# Patient Record
Sex: Female | Born: 1975
Health system: Southern US, Community
[De-identification: ages and names within clinical notes are randomized; demographics above are authoritative.]

## PROBLEM LIST (undated history)

## (undated) DIAGNOSIS — K5190 Ulcerative colitis, unspecified, without complications: Secondary | ICD-10-CM

## (undated) DIAGNOSIS — D219 Benign neoplasm of connective and other soft tissue, unspecified: Secondary | ICD-10-CM

## (undated) DIAGNOSIS — F411 Generalized anxiety disorder: Secondary | ICD-10-CM

## (undated) DIAGNOSIS — F32A Depression, unspecified: Secondary | ICD-10-CM

## (undated) DIAGNOSIS — A419 Sepsis, unspecified organism: Secondary | ICD-10-CM

## (undated) DIAGNOSIS — O09529 Supervision of elderly multigravida, unspecified trimester: Secondary | ICD-10-CM

## (undated) DIAGNOSIS — Z973 Presence of spectacles and contact lenses: Secondary | ICD-10-CM

## (undated) DIAGNOSIS — Z8719 Personal history of other diseases of the digestive system: Secondary | ICD-10-CM

## (undated) DIAGNOSIS — R011 Cardiac murmur, unspecified: Secondary | ICD-10-CM

## (undated) DIAGNOSIS — D649 Anemia, unspecified: Secondary | ICD-10-CM

## (undated) HISTORY — DX: Depression, unspecified: F32.A

## (undated) HISTORY — DX: Benign neoplasm of connective and other soft tissue, unspecified: D21.9

## (undated) HISTORY — DX: Sepsis, unspecified organism: A41.9

## (undated) HISTORY — DX: Ulcerative colitis, unspecified, without complications: K51.90

## (undated) HISTORY — DX: Generalized anxiety disorder: F41.1

## (undated) HISTORY — DX: Supervision of elderly multigravida, unspecified trimester: O09.529

## (undated) HISTORY — PX: WISDOM TOOTH EXTRACTION: SHX21

## (undated) HISTORY — PX: ABDOMINAL HYSTERECTOMY: SHX81

## (undated) HISTORY — PX: UTERINE SEPTUM RESECTION: SHX5386

## (undated) HISTORY — DX: Personal history of other diseases of the digestive system: Z87.19

---

## 2000-12-24 HISTORY — PX: WISDOM TOOTH EXTRACTION: SHX21

## 2007-08-18 ENCOUNTER — Inpatient Hospital Stay (HOSPITAL_COMMUNITY): Admission: AD | Admit: 2007-08-18 | Discharge: 2007-08-20 | Payer: Self-pay | Admitting: Obstetrics and Gynecology

## 2010-11-20 ENCOUNTER — Encounter: Payer: Self-pay | Admitting: Family Medicine

## 2010-11-20 DIAGNOSIS — K5190 Ulcerative colitis, unspecified, without complications: Secondary | ICD-10-CM | POA: Insufficient documentation

## 2010-11-20 DIAGNOSIS — K529 Noninfective gastroenteritis and colitis, unspecified: Secondary | ICD-10-CM | POA: Insufficient documentation

## 2010-11-22 ENCOUNTER — Ambulatory Visit: Payer: Self-pay | Admitting: Family Medicine

## 2010-11-22 DIAGNOSIS — R209 Unspecified disturbances of skin sensation: Secondary | ICD-10-CM

## 2011-01-23 NOTE — Miscellaneous (Signed)
Summary: Past Medical History  Clinical Lists Changes  Problems: Added new problem of COLITIS, ULCERATIVE (ICD-556.9) Observations: Added new observation of NKA: T (November 27, 2010 12:03) Added new observation of REGULAREXERC: no (2010-11-27 12:03) Added new observation of DRUG USE: no (2010-11-27 12:03) Added new observation of ALCOHOL COM: yes (2010/11/27 12:03) Added new observation of SMOK STATUS: never (Nov 27, 2010 12:03) Added new observation of SOCIAL HX: Married 35 year old daughter. Never Smoked Alcohol use-yes Drug use-no Regular exercise-no  (November 27, 2010 12:03) Added new observation of MARITAL STAT: Married (Nov 27, 2010 12:03) Added new observation of FAMILY HX: Mother died of Mesothelioma 2009/09/24 (2010/11/27 12:03) Added new observation of PAST SURG HX: D & C- spontaneous abortion, 2006 (11-27-2010 12:03) Added new observation of PAST MED HX: Ulcerative colitis- Dr. Cristina Gong G2P1 (2010/11/27 12:03)      Past Medical History:    Ulcerative colitis- Dr. Trudie Reed  Past Surgical History:    D & C- spontaneous abortion, 2006    Family History: Mother died of Mesothelioma 2009/09/24  Social History: Married 35 year old daughter. Never Smoked Alcohol use-yes Drug use-no Regular exercise-no Smoking Status:  never Drug Use:  no Does Patient Exercise:  no

## 2011-01-23 NOTE — Assessment & Plan Note (Signed)
Summary: Per discussion   Vital Signs:  Patient profile:   35 year old female Weight:      122 pounds Temp:     98.3 degrees F oral Pulse rate:   66 / minute BP sitting:   90 / 60  (left arm)  Vitals Entered By: Malachi Bonds CMA (November 22, 2010 8:31 AM) CC: numbness and tingling in face   History of Present Illness: 35 yo woman here today to establish care.  GYNMarvel Plan.  PMD- none.  UC- not on meds.  follows w/ Bucchini.  rare flares.  will take prednisone as needed.  numbness- Friday had sensation of food on face but was told by husband there was nothing there.  chin then when 'totally numb' w/ exception of feeling pressure.  that night chin felt cold- 'like neuropathic'.  sxs persisted through Monday.  now almost resolved.  no recent trauma, nasal congestion, sinus pressure.  doesn't grind teeth.  was walking in the park at time of numbness- no pressure on jaw/chin.  no drooling or eye droop, facial droop, slurred speech.  no weakness/numbness of extremities.  Preventive Screening-Counseling & Management  Alcohol-Tobacco     Alcohol drinks/day: <1     Smoking Status: never  Caffeine-Diet-Exercise     Does Patient Exercise: no      Sexual History:  currently monogamous.        Drug Use:  never.    Current Medications (verified): 1)  None  Allergies (verified): No Known Drug Allergies  Family History: Mother died of Mesothelioma 2009/10/03 Breast Cancer- no Colon Cancer- no  Social History: Married- [place] daughter- Abby (2008). Never Smoked Alcohol use-yes Drug use-no Regular exercise-no Sexual History:  currently monogamous Drug Use:  never  Review of Systems      See HPI  Physical Exam  General:  Well-developed,well-nourished,in no acute distress; alert,appropriate and cooperative throughout examination Head:  Normocephalic and atraumatic without obvious abnormalities. No apparent alopecia or balding. Eyes:  No corneal or conjunctival inflammation  noted. EOMI. Perrla. Funduscopic exam benign, without hemorrhages, exudates or papilledema. Vision grossly normal. Mouth:  Oral mucosa and oropharynx without lesions or exudates.  Teeth in good repair. Neck:  No deformities, masses, or tenderness noted. Pulses:  +2 carotid Extremities:  no C/C/E Neurologic:  No cranial nerve deficits noted. Station and gait are normal. Plantar reflexes are down-going bilaterally. DTRs are symmetrical throughout. Sensory, motor and coordinative functions appear intact.   Impression & Recommendations:  Problem # 1:  COLITIS, ULCERATIVE (ICD-556.9) Assessment New follows w/ Buccini.  not on chronic meds.  steroids as needed for flares.  Problem # 2:  FACIAL PARESTHESIA (ICD-782.0) Assessment: New sxs have completely resolved.  will hold on neuro referral at this time but if sxs recur will send for immediate evaluation.  remainder of neuro exam intact.  Patient Instructions: 1)  Follow up as needed 2)  Call if this happens again and we'll do neuro 3)  Let me know if you have any questions or concerns 4)  Welcome!  We're glad to have you!   Orders Added: 1)  New Patient Level II [31438]

## 2011-04-23 ENCOUNTER — Telehealth: Payer: Self-pay | Admitting: Family Medicine

## 2011-04-23 MED ORDER — BUSPIRONE HCL 15 MG PO TABS: 15.0000 mg | ORAL_TABLET | Freq: Two times a day (BID) | ORAL | Status: AC

## 2011-04-23 NOTE — Telephone Encounter (Signed)
Pt called reporting increased anxiety due to issues at work.  Still having trouble dealing w/ mom's death.  Has never been on SSRI previously but reports she is now having panic attacks.  Would like to start meds but is considering pregnancy.  Will start Buspar (Category B).

## 2011-05-11 NOTE — Discharge Summary (Signed)
NAME:  JAEDIN, TRUMBO NO.:  [id number]   MEDICAL RECORD NO.:  [id number]          PATIENT TYPE:  INP   LOCATION:  9103                          FACILITY:  [facility]   PHYSICIAN:  Paula Compton, M.D. DATE OF BIRTH:  [birth year]   DATE OF ADMISSION:  08/18/2007  DATE OF DISCHARGE:  08/20/2007                               DISCHARGE SUMMARY   DISCHARGE DIAGNOSES:  1. Term pregnancy at 38 plus weeks, delivered.  2. Status post normal spontaneous vaginal delivery.   DISCHARGE MEDICATIONS:  1. Motrin 600 mg p.o. every 6 hours.  2. Percocet 1-2 tablets p.o. every 4 hours p.r.n.   DISCHARGE FOLLOWUP:  The patient is to followup in the office in 6 weeks  for routine postpartum exam.   HOSPITAL COURSE:  Patient is a 35 year old, G2, P0-0-1-0, who was  admitted in labor.  Her prenatal care had been relatively uncomplicated,  and she arrived contracting with cervical change noted to be 3-4 cm and  90% effaced. In the preadmission unit, labs are as follows.  Blood type  A positive, antibody negative, RPR nonreactive, rubella immune,  hepatitis B surface antigen negative, HIV negative, GC negative,  chlamydia negative, 1-hour Glucola 137, group B strep negative, first  trimester screen negative.  She had other screening test for Ashkenazi  Jewish background which was all negative.  She was a carrier for cystic  fibrosis; however, her husband was negative.  She had no other  significant issues with pregnancy.   PAST MEDICAL HISTORY:  Significant for allergies only.   PAST SURGICAL HISTORY:  She did have repair of a uterine septum  hysteroscopically.   PAST OBSTETRICAL HISTORY:  In 2006, she had a spontaneous miscarriage  with a D&C.   GYN HISTORY:  No significant past GYN history.   On admission, she was afebrile with stable vital signs.  Fetal heart  rate was reactive.  Cervix was 3-4 and 90% and a -1 station on  admission. Shortly thereafter, she was examined and  found be 5 cm and  had rupture of membranes performed. She continued to progress well with  an epidural anesthesia in place. Reached complete dilation and pushed  well with a normal spontaneous vaginal delivery of a vigorous female  infant over a second-degree laceration.  Apgars were 9/9.  Weight was 6  pounds even.  Placenta delivered spontaneously. Secondary laceration was  repaired with 2-0 Vicryl.  Cervix and rectum were intact.  Estimated  blood loss was 400 mL.   She did very well postpartum.  On postpartum day #2, she was ambulating  well.  Her discharge hemoglobin was 9.1.  She was afebrile with stable  vital signs and was doing well with feeding the baby. Therefore, she was  felt stable for discharge home was discharged to follow up in my office  in 6 weeks.      Paula Compton, M.D.  Electronically Signed     KR/MEDQ  D:  09/20/2007  T:  09/20/2007  Job:  [id number]

## 2011-05-25 ENCOUNTER — Telehealth: Payer: Self-pay | Admitting: Family Medicine

## 2011-05-25 MED ORDER — VALACYCLOVIR HCL 1 G PO TABS: 1000.0000 mg | ORAL_TABLET | Freq: Three times a day (TID) | ORAL | Status: AC

## 2011-05-25 NOTE — Telephone Encounter (Signed)
Pt called indicating she has shingles and needs Valtrex sent to the pharmacy.  Script sent.

## 2011-09-14 ENCOUNTER — Telehealth: Payer: Self-pay | Admitting: Family Medicine

## 2011-09-14 MED ORDER — CIPROFLOXACIN HCL 500 MG PO TABS: 500.0000 mg | ORAL_TABLET | Freq: Two times a day (BID) | ORAL | Status: AC

## 2011-09-14 NOTE — Telephone Encounter (Signed)
Pt reports urine dip at work shows UTI.  Will send script for cipro to pt's pharmacy.

## 2011-09-20 ENCOUNTER — Telehealth: Payer: Self-pay | Admitting: Family Medicine

## 2011-09-20 MED ORDER — AMOXICILLIN-POT CLAVULANATE 875-125 MG PO TABS: 1.0000 | ORAL_TABLET | Freq: Two times a day (BID) | ORAL | Status: AC

## 2011-09-20 NOTE — Telephone Encounter (Signed)
Pt calls to report she has an ingrown toenail that has pus accumulating- a paronychia.  Unable to get into podiatry until next week.  Will call in Augmentin.  Pt appreciative.

## 2011-10-05 LAB — CBC
Hemoglobin: 9.1 — ABNORMAL LOW
MCV: 88.7
Platelets: 173
RDW: 13.3
WBC: 8.7

## 2011-10-05 LAB — RPR: RPR Ser Ql: NONREACTIVE

## 2011-11-10 ENCOUNTER — Telehealth: Payer: Self-pay | Admitting: Family Medicine

## 2011-11-10 MED ORDER — CIPROFLOXACIN HCL 500 MG PO TABS: 500.0000 mg | ORAL_TABLET | Freq: Two times a day (BID) | ORAL | Status: AC

## 2011-11-10 MED ORDER — CIPROFLOXACIN HCL 500 MG PO TABS: 500.0000 mg | ORAL_TABLET | Freq: Two times a day (BID) | ORAL | Status: DC

## 2011-11-10 NOTE — Telephone Encounter (Signed)
Pt calls indicating she has hematuria, dysuria, frequency, urgency.  Needs abx called in.  Script sent to pharmacy.

## 2011-11-10 NOTE — Telephone Encounter (Signed)
Addended by: Midge Minium on: 11/10/2011 07:33 PM   Modules accepted: Orders

## 2011-11-10 NOTE — Telephone Encounter (Signed)
Pt's usual pharmacy was closed.  Med sent to [facility] instead.

## 2012-01-30 ENCOUNTER — Telehealth: Payer: Self-pay | Admitting: Family Medicine

## 2012-01-30 MED ORDER — AMOXICILLIN-POT CLAVULANATE 875-125 MG PO TABS: 1.0000 | ORAL_TABLET | Freq: Two times a day (BID) | ORAL | Status: AC

## 2012-01-30 NOTE — Telephone Encounter (Signed)
Pt calls w/ 10 days of sinus sxs.  Will send abx for sinus infxn.

## 2012-05-13 ENCOUNTER — Telehealth: Payer: Self-pay | Admitting: Family Medicine

## 2012-05-13 ENCOUNTER — Other Ambulatory Visit (INDEPENDENT_AMBULATORY_CARE_PROVIDER_SITE_OTHER): Payer: Self-pay

## 2012-05-13 DIAGNOSIS — R0602 Shortness of breath: Secondary | ICD-10-CM

## 2012-05-13 DIAGNOSIS — R5383 Other fatigue: Secondary | ICD-10-CM

## 2012-05-13 DIAGNOSIS — R5381 Other malaise: Secondary | ICD-10-CM

## 2012-05-13 LAB — BASIC METABOLIC PANEL
BUN: 19 mg/dL (ref 6–23)
CO2: 28 mEq/L (ref 19–32)
Chloride: 108 mEq/L (ref 96–112)
GFR: 96.06 mL/min (ref >60.00)
Glucose, Bld: 81 mg/dL (ref 70–99)
Potassium: 4.3 mEq/L (ref 3.5–5.1)
Sodium: 143 mEq/L (ref 135–145)

## 2012-05-13 LAB — CBC WITH DIFFERENTIAL/PLATELET
Basophils Relative: 0.8 % (ref 0.0–3.0)
Eosinophils Relative: 4 % (ref 0.0–5.0)
HCT: 38.9 % (ref 36.0–46.0)
MCV: 90.2 fl (ref 78.0–100.0)
Monocytes Absolute: 0.3 10*3/uL (ref 0.1–1.0)
Monocytes Relative: 6 % (ref 3.0–12.0)
Neutrophils Relative %: 58.6 % (ref 43.0–77.0)
Platelets: 259 10*3/uL (ref 150.0–400.0)
RBC: 4.31 Mil/uL (ref 3.87–5.11)
WBC: 5.6 10*3/uL (ref 4.5–10.5)

## 2012-05-13 NOTE — Telephone Encounter (Signed)
Pt called indicating she is waking multiple times a night w/ SOB.  Also having DOE.  + fatigue.  Hx of anxiety but this has been well controlled.  No recent labs.  Will get labs to r/o anemia, thyroid abnormality and due to nocturnal sxs, H pylori.  Pt expressed understanding and is in agreement w/ plan.

## 2012-05-15 ENCOUNTER — Other Ambulatory Visit: Payer: Self-pay | Admitting: Family Medicine

## 2012-05-15 ENCOUNTER — Ambulatory Visit
Admission: RE | Admit: 2012-05-15 | Discharge: 2012-05-15 | Disposition: A | Discharge status: Home / Self Care | Payer: 59 | Source: Ambulatory Visit | Attending: Family Medicine | Admitting: Family Medicine

## 2012-05-15 DIAGNOSIS — N6310 Unspecified lump in the right breast, unspecified quadrant: Secondary | ICD-10-CM

## 2012-05-21 ENCOUNTER — Other Ambulatory Visit: Payer: 59

## 2012-07-22 ENCOUNTER — Other Ambulatory Visit: Payer: Self-pay | Admitting: Family Medicine

## 2012-07-22 ENCOUNTER — Other Ambulatory Visit (INDEPENDENT_AMBULATORY_CARE_PROVIDER_SITE_OTHER): Payer: 59

## 2012-07-22 DIAGNOSIS — N912 Amenorrhea, unspecified: Secondary | ICD-10-CM

## 2012-07-22 NOTE — Addendum Note (Signed)
Addended by: Ellamae Sia on: 07/22/2012 11:48 AM   Modules accepted: Orders

## 2012-07-24 ENCOUNTER — Other Ambulatory Visit: Payer: Self-pay | Admitting: Family Medicine

## 2012-07-24 ENCOUNTER — Other Ambulatory Visit (INDEPENDENT_AMBULATORY_CARE_PROVIDER_SITE_OTHER): Payer: 59

## 2012-07-24 DIAGNOSIS — N912 Amenorrhea, unspecified: Secondary | ICD-10-CM

## 2012-07-24 LAB — HCG, QUANTITATIVE, PREGNANCY: hCG, Beta Chain, Quant, S: 6.09 m[IU]/mL

## 2012-10-17 ENCOUNTER — Other Ambulatory Visit (HOSPITAL_COMMUNITY): Payer: Self-pay | Admitting: Obstetrics and Gynecology

## 2012-10-17 DIAGNOSIS — N96 Recurrent pregnancy loss: Secondary | ICD-10-CM

## 2012-10-22 ENCOUNTER — Ambulatory Visit (HOSPITAL_COMMUNITY): Admission: RE | Admit: 2012-10-22 | Payer: 59 | Source: Ambulatory Visit

## 2012-10-22 ENCOUNTER — Ambulatory Visit (HOSPITAL_COMMUNITY): Payer: 59

## 2012-10-24 ENCOUNTER — Ambulatory Visit (HOSPITAL_COMMUNITY)
Admission: RE | Admit: 2012-10-24 | Discharge: 2012-10-24 | Disposition: A | Discharge status: Home / Self Care | Payer: 59 | Source: Ambulatory Visit | Attending: Obstetrics and Gynecology | Admitting: Obstetrics and Gynecology

## 2012-10-24 DIAGNOSIS — N979 Female infertility, unspecified: Secondary | ICD-10-CM | POA: Insufficient documentation

## 2012-10-24 DIAGNOSIS — N96 Recurrent pregnancy loss: Secondary | ICD-10-CM | POA: Insufficient documentation

## 2012-10-24 MED ORDER — IOHEXOL 300 MG/ML  SOLN
10.0000 mL | Freq: Once | INTRAMUSCULAR | Status: AC | PRN
  Administered 2012-10-24: 10 mL

## 2012-12-09 ENCOUNTER — Other Ambulatory Visit: Payer: Self-pay | Admitting: Family Medicine

## 2012-12-09 ENCOUNTER — Ambulatory Visit (INDEPENDENT_AMBULATORY_CARE_PROVIDER_SITE_OTHER)
Admission: RE | Admit: 2012-12-09 | Discharge: 2012-12-09 | Disposition: A | Discharge status: Home / Self Care | Payer: 59 | Source: Ambulatory Visit | Attending: Family Medicine | Admitting: Family Medicine

## 2012-12-09 DIAGNOSIS — M79645 Pain in left finger(s): Secondary | ICD-10-CM

## 2012-12-09 DIAGNOSIS — M79609 Pain in unspecified limb: Secondary | ICD-10-CM

## 2013-02-09 ENCOUNTER — Other Ambulatory Visit: Payer: Self-pay | Admitting: Family Medicine

## 2013-02-09 MED ORDER — HYDROCORTISONE ACE-PRAMOXINE 1-1 % RE FOAM: 1.0000 | Freq: Two times a day (BID) | RECTAL | Status: DC

## 2013-02-23 ENCOUNTER — Other Ambulatory Visit (INDEPENDENT_AMBULATORY_CARE_PROVIDER_SITE_OTHER): Payer: 59

## 2013-02-23 DIAGNOSIS — N912 Amenorrhea, unspecified: Secondary | ICD-10-CM

## 2013-02-23 DIAGNOSIS — Z3200 Encounter for pregnancy test, result unknown: Secondary | ICD-10-CM

## 2013-03-24 LAB — OB RESULTS CONSOLE GC/CHLAMYDIA
Chlamydia: NEGATIVE
Gonorrhea: NEGATIVE

## 2013-03-24 LAB — OB RESULTS CONSOLE ABO/RH

## 2013-03-24 LAB — OB RESULTS CONSOLE ANTIBODY SCREEN: Antibody Screen: NEGATIVE

## 2013-03-24 LAB — OB RESULTS CONSOLE RUBELLA ANTIBODY, IGM: Rubella: IMMUNE

## 2013-04-24 IMAGING — CR DG FINGER MIDDLE 2+V*L*
2 series · 2 of 2 positions shown · non-contrast
Comparison: None.

CLINICAL DATA: Left middle finger injury

LEFT MIDDLE FINGER 2+V

[view not recorded (1 of 2)]
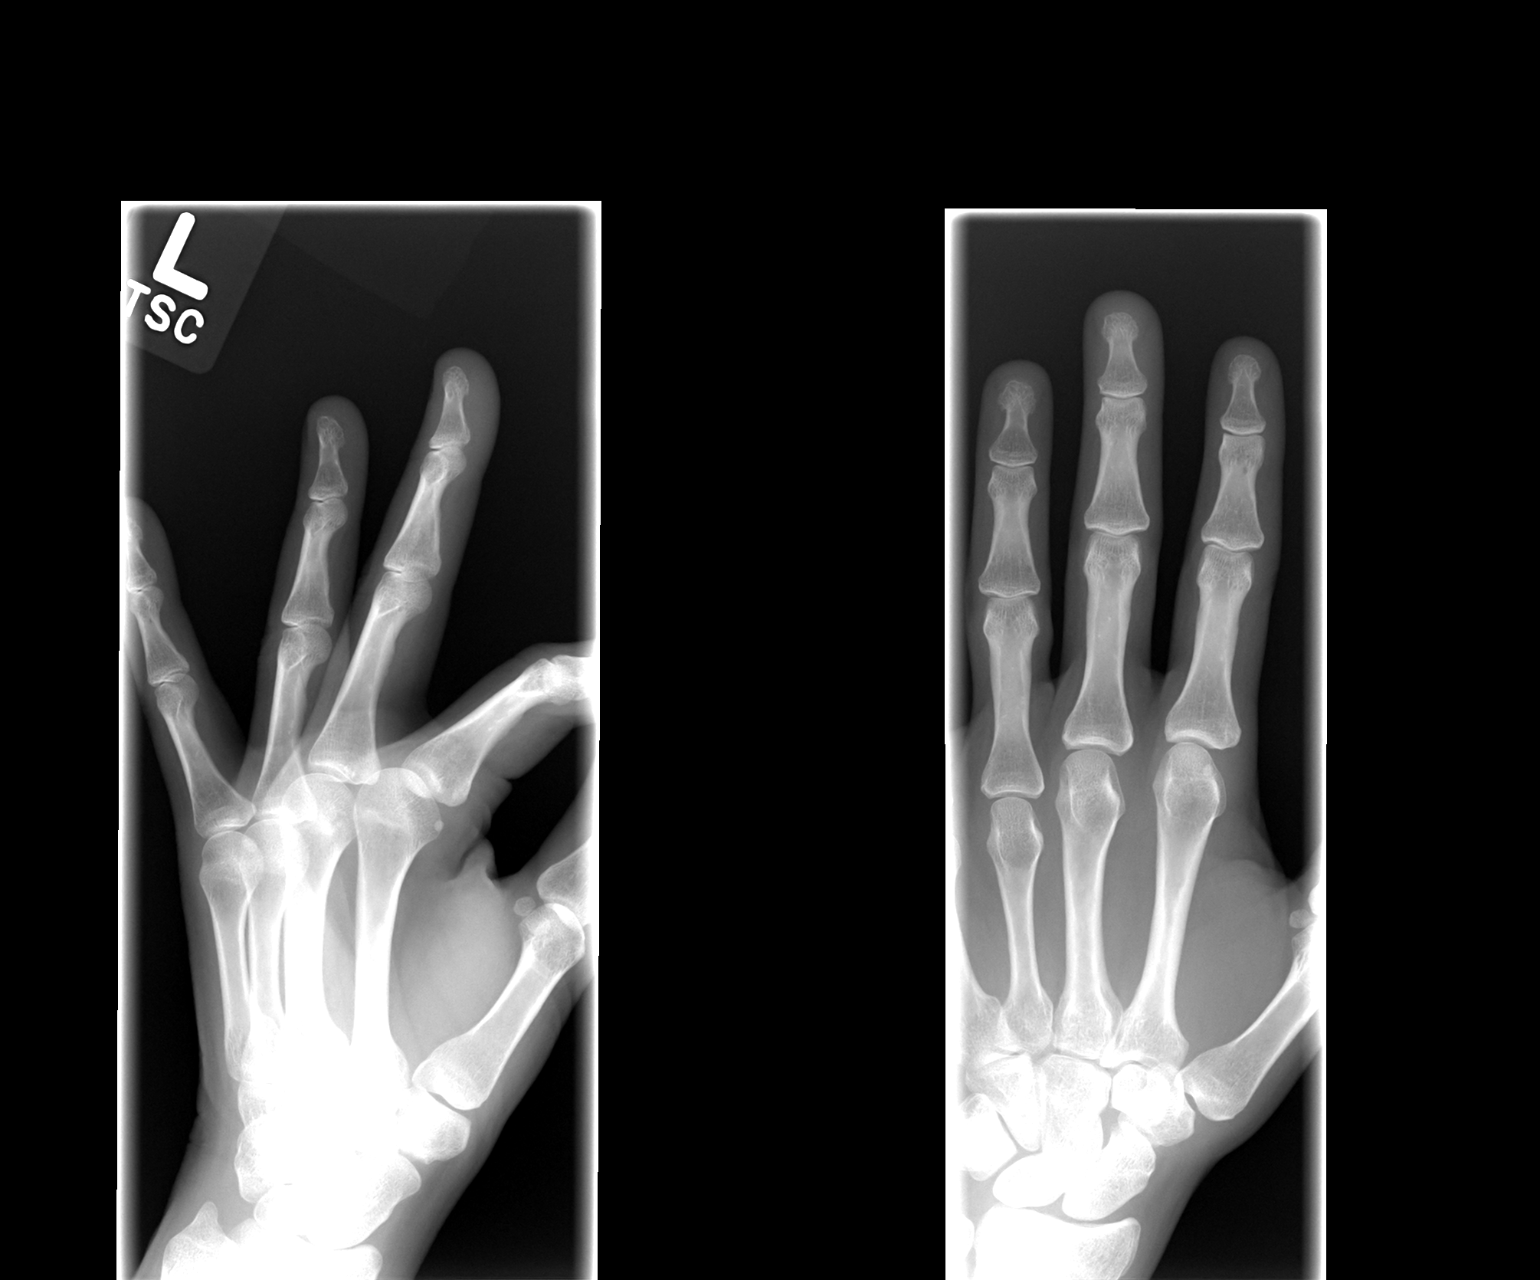

[view not recorded (2 of 2)]
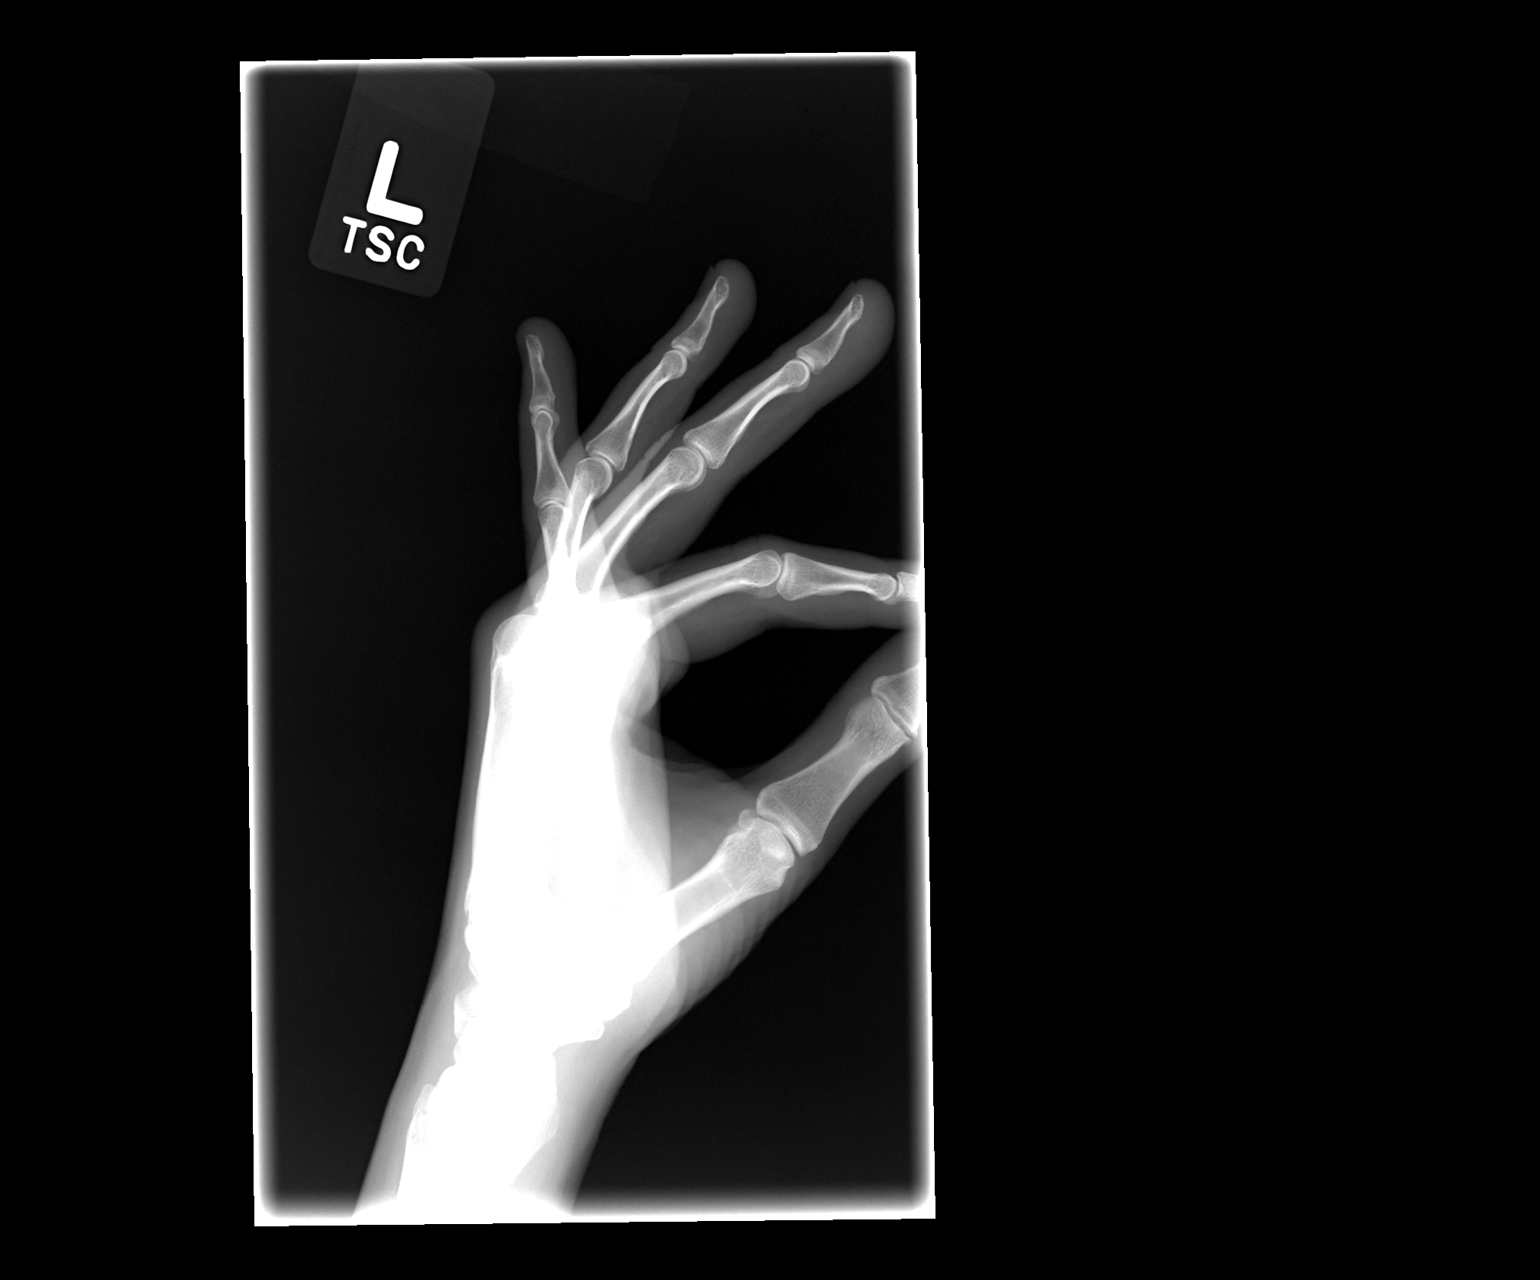

[2 of 2 positions shown; findings below may reference images not displayed]

FINDINGS: Three views of the left third finger submitted.  No acute
fracture or subluxation.  No radiopaque foreign body.
IMPRESSION: No acute fracture or subluxation.

## 2013-05-18 ENCOUNTER — Other Ambulatory Visit: Payer: Self-pay | Admitting: Family Medicine

## 2013-05-18 MED ORDER — PREDNISONE 20 MG PO TABS: ORAL_TABLET | ORAL | Status: DC

## 2013-08-14 ENCOUNTER — Other Ambulatory Visit (INDEPENDENT_AMBULATORY_CARE_PROVIDER_SITE_OTHER): Payer: 59

## 2013-08-14 DIAGNOSIS — R7309 Other abnormal glucose: Secondary | ICD-10-CM

## 2013-08-14 LAB — GLUCOSE TOLERANCE, 3 HOURS
Glucose, 1 Hour GTT: 128 mg/dL
Glucose, GTT - 3 Hour: 97 mg/dL

## 2013-10-07 ENCOUNTER — Encounter (HOSPITAL_COMMUNITY): Payer: Self-pay | Admitting: *Deleted

## 2013-10-07 ENCOUNTER — Telehealth (HOSPITAL_COMMUNITY): Payer: Self-pay | Admitting: *Deleted

## 2013-10-07 NOTE — Telephone Encounter (Signed)
Preadmission screen  

## 2013-10-12 ENCOUNTER — Encounter (HOSPITAL_COMMUNITY): Payer: Self-pay | Admitting: *Deleted

## 2013-10-12 ENCOUNTER — Inpatient Hospital Stay (HOSPITAL_COMMUNITY)
Admission: AD | Admit: 2013-10-12 | Discharge: 2013-10-16 | DRG: 766 | Disposition: A | Discharge status: Home / Self Care | Payer: 59 | Source: Ambulatory Visit | Attending: Obstetrics and Gynecology | Admitting: Obstetrics and Gynecology

## 2013-10-12 ENCOUNTER — Observation Stay (HOSPITAL_COMMUNITY)
Admission: RE | Admit: 2013-10-12 | Discharge: 2013-10-12 | Disposition: A | Discharge status: Home / Self Care | Payer: 59 | Source: Ambulatory Visit | Attending: Obstetrics and Gynecology | Admitting: Obstetrics and Gynecology

## 2013-10-12 DIAGNOSIS — O321XX0 Maternal care for breech presentation, not applicable or unspecified: Principal | ICD-10-CM | POA: Diagnosis present

## 2013-10-12 DIAGNOSIS — O09529 Supervision of elderly multigravida, unspecified trimester: Secondary | ICD-10-CM | POA: Diagnosis present

## 2013-10-12 MED ORDER — TERBUTALINE SULFATE 1 MG/ML IJ SOLN
0.2500 mg | Freq: Once | INTRAMUSCULAR | Status: AC
  Administered 2013-10-12: 0.25 mg via SUBCUTANEOUS
  Filled 2013-10-12: qty 1

## 2013-10-12 MED ORDER — LACTATED RINGERS IV SOLN
INTRAVENOUS | Status: DC
  Administered 2013-10-12: 09:00:00 via INTRAVENOUS

## 2013-10-12 NOTE — MAU Note (Signed)
PT SAYS SHE STARTED HURTING BAD AT [place]-  DID NOT WORK.     LAST WEEK 1 CM.    DENIES HSV AND MRSA.

## 2013-10-12 NOTE — H&P (Signed)
PROCEDURE:  External cephalic Version  Patient is a 37 year old female at 37 weeks 3 days admitted for ECV. Baby is known frank breech. Patient counseled about ECV versus scheduled C Section. Patient wishes to proceed with ECV. She was told the risks of SROM, fetal distress, emergency C Section and possible injury to the baby. She was also advised on the extreme pain with the procedure.  Ultrasound today - baby is still frank breech. Vertex in the RUQ.  Terbutaline given  With 3 attempts at forward roll, the vertex moved to the RLQ and the baby assumed a transverse position. Because of the extreme discomfort with the procedure we decided to stop at that point.  Ultrasound and FHR checks throughout the procedure the FHR was 140s and no decelerations were ever noted.  NST now for monitoring post procedure. Will then discharge home and follow up in office in 2 days.

## 2013-10-12 NOTE — MAU Note (Signed)
Pt reports she started having contractions about every 2-6 mins since 2000 tonight in her back and lower abdomen.

## 2013-10-13 ENCOUNTER — Inpatient Hospital Stay (HOSPITAL_COMMUNITY): Payer: 59 | Admitting: Anesthesiology

## 2013-10-13 ENCOUNTER — Encounter (HOSPITAL_COMMUNITY): Payer: 59 | Admitting: Anesthesiology

## 2013-10-13 ENCOUNTER — Encounter (HOSPITAL_COMMUNITY)
Admission: AD | Disposition: A | Discharge status: Home / Self Care | Payer: Self-pay | Source: Ambulatory Visit | Attending: Obstetrics and Gynecology

## 2013-10-13 ENCOUNTER — Encounter (HOSPITAL_COMMUNITY): Payer: Self-pay | Admitting: Anesthesiology

## 2013-10-13 DIAGNOSIS — O479 False labor, unspecified: Secondary | ICD-10-CM | POA: Diagnosis present

## 2013-10-13 DIAGNOSIS — O321XX0 Maternal care for breech presentation, not applicable or unspecified: Secondary | ICD-10-CM | POA: Diagnosis present

## 2013-10-13 LAB — CBC
MCH: 31.2 pg (ref 26.0–34.0)
MCHC: 35 g/dL (ref 30.0–36.0)
MCV: 89.1 fL (ref 78.0–100.0)
Platelets: 169 10*3/uL (ref 150–400)
RBC: 3.59 MIL/uL — ABNORMAL LOW (ref 3.87–5.11)
RDW: 13.3 % (ref 11.5–15.5)

## 2013-10-13 SURGERY — Surgical Case
Anesthesia: Spinal | Site: Abdomen | Wound class: Clean Contaminated

## 2013-10-13 MED ORDER — EPHEDRINE 5 MG/ML INJ
INTRAVENOUS | Status: AC
  Filled 2013-10-13: qty 10

## 2013-10-13 MED ORDER — ONDANSETRON HCL 4 MG/2ML IJ SOLN: 4.0000 mg | Freq: Three times a day (TID) | INTRAMUSCULAR | Status: DC | PRN

## 2013-10-13 MED ORDER — BUPIVACAINE IN DEXTROSE 0.75-8.25 % IT SOLN
INTRATHECAL | Status: DC | PRN
  Administered 2013-10-13: 1.4 mL via INTRATHECAL

## 2013-10-13 MED ORDER — LANOLIN HYDROUS EX OINT: 1.0000 "application " | TOPICAL_OINTMENT | CUTANEOUS | Status: DC | PRN

## 2013-10-13 MED ORDER — FENTANYL CITRATE 0.05 MG/ML IJ SOLN
INTRAMUSCULAR | Status: AC
  Filled 2013-10-13: qty 2

## 2013-10-13 MED ORDER — PRENATAL MULTIVITAMIN CH
1.0000 | ORAL_TABLET | Freq: Every day | ORAL | Status: DC
  Administered 2013-10-13 – 2013-10-16 (×4): 1 via ORAL
  Filled 2013-10-13 (×4): qty 1

## 2013-10-13 MED ORDER — NALBUPHINE HCL 10 MG/ML IJ SOLN
5.0000 mg | INTRAMUSCULAR | Status: DC | PRN
  Filled 2013-10-13: qty 1

## 2013-10-13 MED ORDER — NALOXONE HCL 1 MG/ML IJ SOLN
1.0000 ug/kg/h | INTRAVENOUS | Status: DC | PRN
  Filled 2013-10-13: qty 2

## 2013-10-13 MED ORDER — SCOPOLAMINE 1 MG/3DAYS TD PT72
1.0000 | MEDICATED_PATCH | Freq: Once | TRANSDERMAL | Status: AC
  Administered 2013-10-13: 1.5 mg via TRANSDERMAL

## 2013-10-13 MED ORDER — ONDANSETRON HCL 4 MG PO TABS: 4.0000 mg | ORAL_TABLET | ORAL | Status: DC | PRN

## 2013-10-13 MED ORDER — SIMETHICONE 80 MG PO CHEW
80.0000 mg | CHEWABLE_TABLET | Freq: Three times a day (TID) | ORAL | Status: DC
  Administered 2013-10-13 – 2013-10-16 (×10): 80 mg via ORAL
  Filled 2013-10-13 (×9): qty 1

## 2013-10-13 MED ORDER — CITRIC ACID-SODIUM CITRATE 334-500 MG/5ML PO SOLN
ORAL | Status: AC
  Filled 2013-10-13: qty 15

## 2013-10-13 MED ORDER — 0.9 % SODIUM CHLORIDE (POUR BTL) OPTIME
TOPICAL | Status: DC | PRN
  Administered 2013-10-13: 1000 mL

## 2013-10-13 MED ORDER — EPHEDRINE SULFATE 50 MG/ML IJ SOLN
INTRAMUSCULAR | Status: DC | PRN
  Administered 2013-10-13: 15 mg via INTRAVENOUS
  Administered 2013-10-13: 10 mg via INTRAVENOUS

## 2013-10-13 MED ORDER — LACTATED RINGERS IV SOLN
INTRAVENOUS | Status: DC
  Administered 2013-10-13: 05:00:00 via INTRAVENOUS

## 2013-10-13 MED ORDER — HYDROMORPHONE HCL PF 1 MG/ML IJ SOLN
INTRAMUSCULAR | Status: AC
  Administered 2013-10-13: 1 mg
  Filled 2013-10-13: qty 1

## 2013-10-13 MED ORDER — ONDANSETRON HCL 4 MG/2ML IJ SOLN
INTRAMUSCULAR | Status: DC | PRN
  Administered 2013-10-13: 4 mg via INTRAVENOUS

## 2013-10-13 MED ORDER — KETOROLAC TROMETHAMINE 30 MG/ML IJ SOLN
INTRAMUSCULAR | Status: AC
  Administered 2013-10-13: 30 mg via INTRAVENOUS
  Filled 2013-10-13: qty 1

## 2013-10-13 MED ORDER — DIPHENHYDRAMINE HCL 25 MG PO CAPS: 25.0000 mg | ORAL_CAPSULE | ORAL | Status: DC | PRN

## 2013-10-13 MED ORDER — ONDANSETRON HCL 4 MG/2ML IJ SOLN: 4.0000 mg | INTRAMUSCULAR | Status: DC | PRN

## 2013-10-13 MED ORDER — MORPHINE SULFATE 0.5 MG/ML IJ SOLN
INTRAMUSCULAR | Status: AC
  Filled 2013-10-13: qty 10

## 2013-10-13 MED ORDER — NALBUPHINE SYRINGE 5 MG/0.5 ML
INJECTION | INTRAMUSCULAR | Status: AC
  Filled 2013-10-13: qty 1

## 2013-10-13 MED ORDER — PHENYLEPHRINE 40 MCG/ML (10ML) SYRINGE FOR IV PUSH (FOR BLOOD PRESSURE SUPPORT)
PREFILLED_SYRINGE | INTRAVENOUS | Status: AC
  Filled 2013-10-13: qty 5

## 2013-10-13 MED ORDER — FENTANYL CITRATE 0.05 MG/ML IJ SOLN
25.0000 ug | INTRAMUSCULAR | Status: DC | PRN
  Administered 2013-10-13 (×3): 50 ug via INTRAVENOUS

## 2013-10-13 MED ORDER — METOCLOPRAMIDE HCL 5 MG/ML IJ SOLN: 10.0000 mg | Freq: Three times a day (TID) | INTRAMUSCULAR | Status: DC | PRN

## 2013-10-13 MED ORDER — NALBUPHINE HCL 10 MG/ML IJ SOLN
5.0000 mg | INTRAMUSCULAR | Status: DC | PRN
  Administered 2013-10-13: 10 mg via INTRAVENOUS
  Filled 2013-10-13: qty 1

## 2013-10-13 MED ORDER — OXYTOCIN 10 UNIT/ML IJ SOLN
INTRAMUSCULAR | Status: AC
  Filled 2013-10-13: qty 4

## 2013-10-13 MED ORDER — WITCH HAZEL-GLYCERIN EX PADS: 1.0000 "application " | MEDICATED_PAD | CUTANEOUS | Status: DC | PRN

## 2013-10-13 MED ORDER — ZOLPIDEM TARTRATE 5 MG PO TABS: 5.0000 mg | ORAL_TABLET | Freq: Every evening | ORAL | Status: DC | PRN

## 2013-10-13 MED ORDER — DIPHENHYDRAMINE HCL 50 MG/ML IJ SOLN: 25.0000 mg | INTRAMUSCULAR | Status: DC | PRN

## 2013-10-13 MED ORDER — KETOROLAC TROMETHAMINE 30 MG/ML IJ SOLN
30.0000 mg | Freq: Four times a day (QID) | INTRAMUSCULAR | Status: AC | PRN
  Administered 2013-10-13: 30 mg via INTRAVENOUS
  Filled 2013-10-13: qty 1

## 2013-10-13 MED ORDER — SENNOSIDES-DOCUSATE SODIUM 8.6-50 MG PO TABS
2.0000 | ORAL_TABLET | ORAL | Status: DC
Start: 2013-10-14 — End: 2013-10-16
  Administered 2013-10-13 – 2013-10-14 (×2): 2 via ORAL
  Filled 2013-10-13 (×2): qty 2

## 2013-10-13 MED ORDER — ONDANSETRON HCL 4 MG/2ML IJ SOLN
INTRAMUSCULAR | Status: AC
  Filled 2013-10-13: qty 2

## 2013-10-13 MED ORDER — OXYTOCIN 10 UNIT/ML IJ SOLN
40.0000 [IU] | INTRAVENOUS | Status: DC | PRN
  Administered 2013-10-13: 40 [IU] via INTRAVENOUS

## 2013-10-13 MED ORDER — OXYTOCIN 40 UNITS IN LACTATED RINGERS INFUSION - SIMPLE MED: 62.5000 mL/h | INTRAVENOUS | Status: AC

## 2013-10-13 MED ORDER — NALOXONE HCL 0.4 MG/ML IJ SOLN: 0.4000 mg | INTRAMUSCULAR | Status: DC | PRN

## 2013-10-13 MED ORDER — FENTANYL CITRATE 0.05 MG/ML IJ SOLN
INTRAMUSCULAR | Status: AC
  Administered 2013-10-13: 50 ug via INTRAVENOUS
  Filled 2013-10-13: qty 2

## 2013-10-13 MED ORDER — ACETAMINOPHEN 325 MG PO TABS
650.0000 mg | ORAL_TABLET | Freq: Once | ORAL | Status: AC
  Administered 2013-10-13: 650 mg via ORAL
  Filled 2013-10-13: qty 2

## 2013-10-13 MED ORDER — PHENYLEPHRINE HCL 10 MG/ML IJ SOLN
INTRAMUSCULAR | Status: DC | PRN
  Administered 2013-10-13 (×2): 80 ug via INTRAVENOUS
  Administered 2013-10-13: 40 ug via INTRAVENOUS

## 2013-10-13 MED ORDER — CEFAZOLIN SODIUM-DEXTROSE 2-3 GM-% IV SOLR
2.0000 g | INTRAVENOUS | Status: AC
  Administered 2013-10-13: 2 g via INTRAVENOUS
  Filled 2013-10-13: qty 50

## 2013-10-13 MED ORDER — MENTHOL 3 MG MT LOZG: 1.0000 | LOZENGE | OROMUCOSAL | Status: DC | PRN

## 2013-10-13 MED ORDER — SCOPOLAMINE 1 MG/3DAYS TD PT72
MEDICATED_PATCH | TRANSDERMAL | Status: AC
  Filled 2013-10-13: qty 1

## 2013-10-13 MED ORDER — CITRIC ACID-SODIUM CITRATE 334-500 MG/5ML PO SOLN
30.0000 mL | Freq: Once | ORAL | Status: AC
  Administered 2013-10-13: 30 mL via ORAL

## 2013-10-13 MED ORDER — LACTATED RINGERS IV SOLN
INTRAVENOUS | Status: DC
  Administered 2013-10-13 (×3): via INTRAVENOUS

## 2013-10-13 MED ORDER — HYDROMORPHONE HCL PF 1 MG/ML IJ SOLN: 1.0000 mg | Freq: Once | INTRAMUSCULAR | Status: DC

## 2013-10-13 MED ORDER — KETOROLAC TROMETHAMINE 30 MG/ML IJ SOLN
30.0000 mg | Freq: Four times a day (QID) | INTRAMUSCULAR | Status: AC | PRN
  Administered 2013-10-13: 30 mg via INTRAMUSCULAR

## 2013-10-13 MED ORDER — SIMETHICONE 80 MG PO CHEW
80.0000 mg | CHEWABLE_TABLET | ORAL | Status: DC
  Administered 2013-10-15: 80 mg via ORAL
  Filled 2013-10-13 (×3): qty 1

## 2013-10-13 MED ORDER — DIPHENHYDRAMINE HCL 50 MG/ML IJ SOLN: 12.5000 mg | INTRAMUSCULAR | Status: DC | PRN

## 2013-10-13 MED ORDER — FENTANYL CITRATE 0.05 MG/ML IJ SOLN
INTRAMUSCULAR | Status: DC | PRN
  Administered 2013-10-13: 25 ug via INTRATHECAL

## 2013-10-13 MED ORDER — MEPERIDINE HCL 25 MG/ML IJ SOLN: 6.2500 mg | INTRAMUSCULAR | Status: DC | PRN

## 2013-10-13 MED ORDER — DIPHENHYDRAMINE HCL 25 MG PO CAPS: 25.0000 mg | ORAL_CAPSULE | Freq: Four times a day (QID) | ORAL | Status: DC | PRN

## 2013-10-13 MED ORDER — TETANUS-DIPHTH-ACELL PERTUSSIS 5-2.5-18.5 LF-MCG/0.5 IM SUSP: 0.5000 mL | Freq: Once | INTRAMUSCULAR | Status: DC

## 2013-10-13 MED ORDER — SODIUM CHLORIDE 0.9 % IJ SOLN: 3.0000 mL | INTRAMUSCULAR | Status: DC | PRN

## 2013-10-13 MED ORDER — MORPHINE SULFATE (PF) 0.5 MG/ML IJ SOLN
INTRAMUSCULAR | Status: DC | PRN
  Administered 2013-10-13: .15 mg via INTRATHECAL

## 2013-10-13 MED ORDER — SIMETHICONE 80 MG PO CHEW: 80.0000 mg | CHEWABLE_TABLET | ORAL | Status: DC | PRN

## 2013-10-13 MED ORDER — IBUPROFEN 600 MG PO TABS
600.0000 mg | ORAL_TABLET | Freq: Four times a day (QID) | ORAL | Status: DC
  Administered 2013-10-13 – 2013-10-16 (×12): 600 mg via ORAL
  Filled 2013-10-13 (×11): qty 1

## 2013-10-13 MED ORDER — OXYCODONE-ACETAMINOPHEN 5-325 MG PO TABS
1.0000 | ORAL_TABLET | ORAL | Status: DC | PRN
  Administered 2013-10-13 (×2): 1 via ORAL
  Administered 2013-10-13: 2 via ORAL
  Administered 2013-10-13: 1 via ORAL
  Administered 2013-10-14: 2 via ORAL
  Administered 2013-10-14 (×4): 1 via ORAL
  Administered 2013-10-15: 2 via ORAL
  Administered 2013-10-15 (×2): 1 via ORAL
  Administered 2013-10-15 – 2013-10-16 (×5): 2 via ORAL
  Filled 2013-10-13 (×4): qty 2
  Filled 2013-10-13 (×2): qty 1
  Filled 2013-10-13: qty 2
  Filled 2013-10-13 (×5): qty 1
  Filled 2013-10-13 (×3): qty 2
  Filled 2013-10-13: qty 1

## 2013-10-13 MED ORDER — DIBUCAINE 1 % RE OINT: 1.0000 "application " | TOPICAL_OINTMENT | RECTAL | Status: DC | PRN

## 2013-10-13 SURGICAL SUPPLY — 34 items
ADH SKN CLS APL DERMABOND .7 (GAUZE/BANDAGES/DRESSINGS) ×1
BARRIER ADHS 3X4 INTERCEED (GAUZE/BANDAGES/DRESSINGS) IMPLANT
BRR ADH 4X3 ABS CNTRL BYND (GAUZE/BANDAGES/DRESSINGS)
CLAMP CORD UMBIL (MISCELLANEOUS) ×1 IMPLANT
CLOTH BEACON ORANGE TIMEOUT ST (SAFETY) ×2 IMPLANT
CONTAINER PREFILL 10% NBF 15ML (MISCELLANEOUS) IMPLANT
DERMABOND ADVANCED (GAUZE/BANDAGES/DRESSINGS) ×1
DERMABOND ADVANCED .7 DNX12 (GAUZE/BANDAGES/DRESSINGS) IMPLANT
DRAPE LG THREE QUARTER DISP (DRAPES) ×4 IMPLANT
DRSG OPSITE POSTOP 4X10 (GAUZE/BANDAGES/DRESSINGS) ×2 IMPLANT
DURAPREP 26ML APPLICATOR (WOUND CARE) ×2 IMPLANT
ELECT REM PT RETURN 9FT ADLT (ELECTROSURGICAL) ×2
ELECTRODE REM PT RTRN 9FT ADLT (ELECTROSURGICAL) ×1 IMPLANT
EXTRACTOR VACUUM M CUP 4 TUBE (SUCTIONS) IMPLANT
GLOVE BIO SURGEON STRL SZ 6.5 (GLOVE) ×2 IMPLANT
GOWN PREVENTION PLUS XLARGE (GOWN DISPOSABLE) ×4 IMPLANT
GOWN STRL REIN XL XLG (GOWN DISPOSABLE) ×4 IMPLANT
KIT ABG SYR 3ML LUER SLIP (SYRINGE) IMPLANT
NEEDLE HYPO 22GX1.5 SAFETY (NEEDLE) IMPLANT
NEEDLE HYPO 25X5/8 SAFETYGLIDE (NEEDLE) ×2 IMPLANT
NS IRRIG 1000ML POUR BTL (IV SOLUTION) ×2 IMPLANT
PACK C SECTION WH (CUSTOM PROCEDURE TRAY) ×2 IMPLANT
PAD OB MATERNITY 4.3X12.25 (PERSONAL CARE ITEMS) ×2 IMPLANT
STAPLER VISISTAT 35W (STAPLE) IMPLANT
SUT CHROMIC 0 CTX 36 (SUTURE) ×6 IMPLANT
SUT PLAIN 0 NONE (SUTURE) IMPLANT
SUT PLAIN 2 0 XLH (SUTURE) IMPLANT
SUT VIC AB 0 CT1 27 (SUTURE) ×6
SUT VIC AB 0 CT1 27XBRD ANBCTR (SUTURE) ×3 IMPLANT
SUT VIC AB 4-0 KS 27 (SUTURE) ×1 IMPLANT
SYR CONTROL 10ML LL (SYRINGE) IMPLANT
TOWEL OR 17X24 6PK STRL BLUE (TOWEL DISPOSABLE) ×2 IMPLANT
TRAY FOLEY CATH 14FR (SET/KITS/TRAYS/PACK) ×2 IMPLANT
WATER STERILE IRR 1000ML POUR (IV SOLUTION) ×1 IMPLANT

## 2013-10-13 NOTE — Anesthesia Postprocedure Evaluation (Signed)
  Anesthesia Post-op Note  Patient: Barbara Nicholson  Procedure(s) Performed: Procedure(s): Primary Cesarean Section Delivery Baby  Girl  @ [phone], Apgars 9/9    (N/A)  Patient Location: PACU  Anesthesia Type:Spinal  Level of Consciousness: awake, alert  and oriented  Airway and Oxygen Therapy: Patient Spontanous Breathing  Post-op Pain: none  Post-op Assessment: Post-op Vital signs reviewed, Patient's Cardiovascular Status Stable, Respiratory Function Stable, Patent Airway, No signs of Nausea or vomiting, Pain level controlled, No headache and No backache  Post-op Vital Signs: Reviewed and stable  Complications: No apparent anesthesia complications

## 2013-10-13 NOTE — Lactation Note (Signed)
This note was copied from the chart of Barbara Nicholson. Lactation Consultation Note: Initial visit with mom who reports that baby latched well after delivery but has been sleepy for the last few hours and she can't get her to latch. Undressed baby and placed in football position. After a couple of attempts baby latched well and nursed for 12 minutes. LIlly off to sleep. Reviewed basic teaching with mom . She reports that her first baby would not latch so she pumped for 6 months. Mom is excited that baby has been latching. Reviewed normal newborn behavior the first 24 hours and encouraged to watch for feeding cues and feed whenever she sees them. No questions at present. BF brochure given with resources for support after DC. To call for assist prn.  Patient Name: Barbara Kelleen Stolze [id number] Date: 10/13/2013 Reason for consult: Initial assessment   Maternal Data Formula Feeding for Exclusion: No Infant to breast within first hour of birth: Yes Has patient been taught Hand Expression?: Yes Does the patient have breastfeeding experience prior to this delivery?: Yes  Feeding Feeding Type: Breast Fed Length of feed: 12 min  LATCH Score/Interventions Latch: Grasps breast easily, tongue down, lips flanged, rhythmical sucking.  Audible Swallowing: A few with stimulation  Type of Nipple: Everted at rest and after stimulation  Comfort (Breast/Nipple): Soft / non-tender     Hold (Positioning): Assistance needed to correctly position infant at breast and maintain latch. Intervention(s): Breastfeeding basics reviewed;Support Pillows;Position options;Skin to skin  LATCH Score: 8  Lactation Tools Discussed/Used     Consult Status Consult Status: Follow-up Date: 10/14/13 Follow-up type: In-patient    Truddie Crumble 10/13/2013, 1:04 PM

## 2013-10-13 NOTE — Anesthesia Preprocedure Evaluation (Signed)
Anesthesia Evaluation  Patient identified by MRN, date of birth, ID band Patient awake    Reviewed: Allergy & Precautions, H&P , NPO status , Patient's Chart, lab work & pertinent test results  Airway Mallampati: III TM Distance: >3 FB Neck ROM: Full    Dental no notable dental hx. (+) Teeth Intact   Pulmonary neg pulmonary ROS,  breath sounds clear to auscultation  Pulmonary exam normal       Cardiovascular negative cardio ROS  Rhythm:Regular Rate:Normal     Neuro/Psych negative neurological ROS  negative psych ROS   GI/Hepatic Neg liver ROS, GERD-  Medicated and Controlled,Ulcerative Colitis   Endo/Other  negative endocrine ROS  Renal/GU negative Renal ROS  negative genitourinary   Musculoskeletal negative musculoskeletal ROS (+)   Abdominal   Peds  Hematology negative hematology ROS (+)   Anesthesia Other Findings   Reproductive/Obstetrics IUP 37 weeks SROM Breech in labor                           Anesthesia Physical Anesthesia Plan  ASA: II and emergent  Anesthesia Plan: Spinal   Post-op Pain Management:    Induction:   Airway Management Planned: Natural Airway  Additional Equipment:   Intra-op Plan:   Post-operative Plan:   Informed Consent: I have reviewed the patients History and Physical, chart, labs and discussed the procedure including the risks, benefits and alternatives for the proposed anesthesia with the patient or authorized representative who has indicated his/her understanding and acceptance.     Plan Discussed with: Anesthesiologist, CRNA and Surgeon  Anesthesia Plan Comments:         Anesthesia Quick Evaluation

## 2013-10-13 NOTE — Progress Notes (Signed)
Subjective: Postpartum Day 0: Cesarean Delivery Patient reports incisional pain and tolerating PO.  Reports pain is improving  Objective: Vital signs in last 24 hours: Temp:  [97.8 F (36.6 C)-98.5 F (36.9 C)] 98.2 F (36.8 C) (10/21 0720) Pulse Rate:  [54-71] 63 (10/21 0720) Resp:  [12-23] 20 (10/21 0720) BP: (92-148)/(32-133) 92/51 mmHg (10/21 0720) SpO2:  [96 %-100 %] 97 % (10/21 0720) Weight:  [147 lb 6 oz (66.849 kg)] 147 lb 6 oz (66.849 kg) (10/20 2312)  Physical Exam:  General: alert and cooperative Lochia: appropriate Uterine Fundus: firm Incision: healing well, minimal eccyhmosis noted at incision, no erythema or edema DVT Evaluation: No evidence of DVT seen on physical exam. Negative Homan's sign. No cords or calf tenderness. No significant calf/ankle edema.   Recent Labs  10/13/13 0026  HGB 11.2*  HCT 32.0*    Assessment/Plan: Status post Cesarean section. Doing well postoperatively.  Continue current care.  CURTIS,CAROL G 10/13/2013, 8:10 AM

## 2013-10-13 NOTE — Discharge Summary (Signed)
  Admission Diagnosis: Breech  Discharge Diagnosis: Transverse position  Hospital course: 37 year old female at 44 w 4 days presents for ECV. Underwent ECV and the baby converted to transverse position. NST reactive post procedure.  Patient discharged home after NST. Will follow up in office on Thursday.

## 2013-10-13 NOTE — Transfer of Care (Signed)
Immediate Anesthesia Transfer of Care Note  Patient: Barbara Nicholson  Procedure(s) Performed: Procedure(s): Primary Cesarean Section Delivery Baby  Girl  @ 0122, Apgars 9/9    (N/A)  Patient Location: PACU  Anesthesia Type:Spinal  Level of Consciousness: awake, alert  and oriented  Airway & Oxygen Therapy: Patient Spontanous Breathing  Post-op Assessment: Report given to PACU RN  Post vital signs: Reviewed and stable  Complications: No apparent anesthesia complications

## 2013-10-13 NOTE — Progress Notes (Signed)
Patient c/o pain 9/10 with movement. She had toradol, dilaudid, and 3 doses of fentanyl in PACU. Patient requesting pain medication but does not want anything really strong right now. Dr. Royce Macadamia called. Order received. Will continue to monitor patient.

## 2013-10-13 NOTE — H&P (Signed)
Barbara Nicholson is a 37 y.o. female presenting for labor evaluation. Underwent unsuccessful ECV yesterday am. Maternal Medical History:  Reason for admission: Contractions.   Contractions: Onset was 3-5 hours ago.   Frequency: regular.   Perceived severity is moderate.    Fetal activity: Perceived fetal activity is normal.   Last perceived fetal movement was within the past hour.    Prenatal complications: no prenatal complications   OB History   Grav Para Term Preterm Abortions TAB SAB Ect Mult Living   5 1 0  3  3   1      Past Medical History  Diagnosis Date  . Hx of ulcerative colitis   . AMA (advanced maternal age) multigravida 35+    Past Surgical History  Procedure Laterality Date  . Uterine septum resection    . Wisdom tooth extraction     Family History: family history includes Mesothelioma in her mother. Social History:  reports that she has never smoked. She does not have any smokeless tobacco history on file. She reports that she does not drink alcohol or use illicit drugs.   Prenatal Transfer Tool  Maternal Diabetes: No Genetic Screening: Normal Maternal Ultrasounds/Referrals: Normal Fetal Ultrasounds or other Referrals:  None Maternal Substance Abuse:  No Significant Maternal Medications:  None Significant Maternal Lab Results:  None Other Comments:  None  Review of Systems  All other systems reviewed and are negative.    Dilation: 2.5 Effacement (%): 70 Exam by:: Lanier Prude, RN Blood pressure 94/58, pulse 59, temperature 98 F (36.7 C), temperature source Oral, resp. rate 18, height 5' 3"  (1.6 m), weight 66.849 kg (147 lb 6 oz), last menstrual period 01/24/2013. Maternal Exam:  Uterine Assessment: Contraction strength is moderate.  Contraction frequency is regular.   Abdomen: Patient reports no abdominal tenderness. Fetal presentation: breech     Physical Exam  Nursing note and vitals reviewed. Constitutional: She appears well-developed.   Cardiovascular: Normal rate.   Respiratory: Effort normal.  GI: Soft.   Breech by bedside ultrasound Prenatal labs: ABO, Rh: A/Positive/-- (04/01 0000) Antibody: Negative (04/01 0000) Rubella: Immune (04/01 0000) RPR: Nonreactive (04/01 0000)  HBsAg: Negative (04/01 0000)  HIV: Non-reactive (04/01 0000)  GBS:     Assessment/Plan: IUP at term  Labor Breech  Primary LTCS Risks reviewed  Consent signed   Reshawn Ostlund L 10/13/2013, 12:42 AM

## 2013-10-13 NOTE — Anesthesia Procedure Notes (Signed)
Spinal  Patient location during procedure: OR Start time: 10/13/2013 1:03 AM Staffing Anesthesiologist: Jadalee Westcott A. Performed by: anesthesiologist  Preanesthetic Checklist Completed: patient identified, site marked, surgical consent, pre-op evaluation, timeout performed, IV checked, risks and benefits discussed and monitors and equipment checked Spinal Block Patient position: sitting Prep: site prepped and draped and DuraPrep Patient monitoring: heart rate, cardiac monitor, continuous pulse ox and blood pressure Approach: midline Location: L3-4 Injection technique: single-shot Needle Needle type: Sprotte  Needle gauge: 24 G Needle length: 9 cm Needle insertion depth: 4.5 cm Assessment Sensory level: T4 Additional Notes Patient tolerated procedure well. Adequate sensory level.

## 2013-10-13 NOTE — Brief Op Note (Signed)
10/12/2013 - 10/13/2013  1:51 AM  PATIENT:  Barbara Nicholson  37 y.o. female  PRE-OPERATIVE DIAGNOSIS:  IUP at 11 w 4 days, Labor Breech   POST-OPERATIVE DIAGNOSIS:   Same  PROCEDURE:  Procedure(s): Primary Cesarean Section Delivery Baby     @      (N/A) Primary Low Transverse CeSearean Sectikon  SURGEON:  Surgeon(s) and Role:    * Cyril Mourning, MD - Primary  PHYSICIAN ASSISTANT:   ASSISTANTS: none   ANESTHESIA:   spinal  EBL:  Total I/O In: 1000 [I.V.:1000] Out: -   BLOOD ADMINISTERED:none  DRAINS: Urinary Catheter (Foley)   LOCAL MEDICATIONS USED:  NONE  SPECIMEN:  No Specimen  DISPOSITION OF SPECIMEN:  N/A  COUNTS:  YES  TOURNIQUET:  * No tourniquets in log *  DICTATION: .Other Dictation: Dictation Number [id number]  PLAN OF CARE: Admit to inpatient   PATIENT DISPOSITION:  PACU - hemodynamically stable.   Delay start of Pharmacological VTE agent (>24hrs) due to surgical blood loss or risk of bleeding: not applicable

## 2013-10-13 NOTE — Op Note (Signed)
NAMEMarland Kitchen  [place], GAMERO NO.:  [id number]  MEDICAL RECORD NO.:  [id number]  LOCATION:  9143                          FACILITY:  [facility]  PHYSICIAN:  Antar Milks L. Conlin Brahm, M.D.DATE OF BIRTH:  1976-10-06  DATE OF PROCEDURE:  10/13/2013 DATE OF DISCHARGE:                              OPERATIVE REPORT   PREOPERATIVE DIAGNOSIS:  Intrauterine pregnancy at 37 weeks and 4 days. Labor and breech presentation.  POSTOPERATIVE DIAGNOSIS:  Intrauterine pregnancy at 37 weeks and 4 days. Labor and breech presentation.  PROCEDURE:  Primary low-transverse cesarean section.  SURGEON:  Olyn Landstrom L. Helane Rima, M.D.  ANESTHESIA:  Spinal.  ESTIMATED BLOOD LOSS:  Less than 500 mL.  COMPLICATIONS:  None.  PROCEDURE:  The patient was taken to the operating room, and her spinal was placed.  She was then prepped and draped in a sterile fashion.  A low transverse incision was made, carried down to the fascia.  Fascia was scored in the midline and extended laterally.  Rectus muscles were separated in the midline.  The peritoneum was entered bluntly.  The peritoneal incision was then stretched.  The bladder blade was inserted. The bladder flap was created and a low transverse incision was made in the uterus.  Amniotic fluid was clear.  The baby was in double footling breech presentation, was delivered easily via complete breech extraction.  The baby was a female infant.  The cord was clamped and cut.  The baby was vigorous in the operating room.  Apgars were 9 at one minute and 9 at five minutes, was a female infant.  The placenta was manually removed, noted to be normal intact with a three-vessel cord. The uterus was exteriorized and cleared of all clots and debris.  The uterine cavity was cleared of all clots and debris.  The uterine incision was closed in 2 layers using 0 chromic in a running locked stitch.  Uterus was returned to the abdomen.  Irrigation was performed. Hemostasis was  excellent.  The peritoneum was reapproximated using 0 Vicryl as well as the rectus muscles.  The fascia was closed using 0 Vicryl, starting at each corner, meeting in the midline using a running stitch x2.  After irrigation of the subcutaneous layer, the skin was closed with 4-0 Vicryl on a Keith needle.  Dermabond was applied.  All sponge, lap, and instrument counts were correct x2.  The patient went to recovery room in stable condition.    Shawntay Prest L. Helane Rima, M.D.    Nevin Bloodgood  D:  10/13/2013  T:  10/13/2013  Job:  [id number]

## 2013-10-13 NOTE — Anesthesia Postprocedure Evaluation (Signed)
  Anesthesia Post-op Note  Patient: Barbara Nicholson  Procedure(s) Performed: Procedure(s): Primary Cesarean Section Delivery Baby  Girl  @ 0122, Apgars 9/9    (N/A)  Patient Location: Mother/Baby  Anesthesia Type:Spinal  Level of Consciousness: awake  Airway and Oxygen Therapy: Patient Spontanous Breathing  Post-op Pain: none  Post-op Assessment: Patient's Cardiovascular Status Stable, Respiratory Function Stable, Patent Airway, No signs of Nausea or vomiting, Adequate PO intake, Pain level controlled, No headache, No backache, No residual numbness and No residual motor weakness  Post-op Vital Signs: Reviewed and stable  Complications: No apparent anesthesia complications

## 2013-10-14 ENCOUNTER — Encounter (HOSPITAL_COMMUNITY): Payer: Self-pay | Admitting: Obstetrics and Gynecology

## 2013-10-14 LAB — CBC
HCT: 28 % — ABNORMAL LOW (ref 36.0–46.0)
MCHC: 34.3 g/dL (ref 30.0–36.0)
MCV: 90 fL (ref 78.0–100.0)
RBC: 3.11 MIL/uL — ABNORMAL LOW (ref 3.87–5.11)
RDW: 13.5 % (ref 11.5–15.5)

## 2013-10-14 MED ORDER — HYDROMORPHONE HCL 2 MG PO TABS
2.0000 mg | ORAL_TABLET | ORAL | Status: DC | PRN
  Administered 2013-10-14 – 2013-10-15 (×5): 2 mg via ORAL
  Filled 2013-10-14 (×5): qty 1

## 2013-10-14 NOTE — Progress Notes (Signed)
Subjective: Postpartum Day 1: Cesarean Delivery Patient reports incisional pain, tolerating PO, + flatus and no problems voiding.    Objective: Vital signs in last 24 hours: Temp:  [97.6 F (36.4 C)-98.7 F (37.1 C)] 97.6 F (36.4 C) (10/22 0542) Pulse Rate:  [58-83] 61 (10/22 0542) Resp:  [18] 18 (10/22 0542) BP: (90-103)/(43-62) 103/62 mmHg (10/22 0542) SpO2:  [97 %-99 %] 99 % (10/22 0542)  Physical Exam:  General: alert and cooperative Lochia: appropriate Uterine Fundus: firm Incision: healing well, no erythema or ecchymosis noted. Derma bond noted DVT Evaluation: No evidence of DVT seen on physical exam. Negative Homan's sign. No cords or calf tenderness. No significant calf/ankle edema.   Recent Labs  10/13/13 0026 10/14/13 0625  HGB 11.2* 9.6*  HCT 32.0* 28.0*    Assessment/Plan: Status post Cesarean section. Postoperative course complicated by incisional pain  Change pain meds to dilaudid.  Lexx Monte G 10/14/2013, 8:20 AM

## 2013-10-15 NOTE — Lactation Note (Signed)
This note was copied from the chart of Barbara Nicholson. Lactation Consultation Note Mom states breast feeding is going very well. Mom has baby positioned in left side football when I enter room. Baby just finishing up a feeding, self detached and fell asleep. Unable to assess audible swallowing as baby stopped feeding when I entered room. Mom states feedings are comfortable, is using comfort gels for some bruising which occurred on day 1, states baby has a good deep latch with rhythmic sucking. Mom and dad are very excited that baby is latching so well, given that their first child never latched at all.  Mom had questions regarding how much baby is getting and how to know baby is getting enough. Reviewed br feeding basics and baby and me book, discussed mom's questions.  Enc mom to call for assistance if she has any concerns.   Patient Name: Barbara Nicholson'I Date: 10/15/2013 Reason for consult: Follow-up assessment   Maternal Data    Feeding Feeding Type: Breast Fed Length of feed: 120 min  LATCH Score/Interventions Latch: Grasps breast easily, tongue down, lips flanged, rhythmical sucking.  Audible Swallowing:  (Unable to assess)  Type of Nipple: Everted at rest and after stimulation  Comfort (Breast/Nipple): Filling, red/small blisters or bruises, mild/mod discomfort  Interventions (Mild/moderate discomfort): Comfort gels  Hold (Positioning): No assistance needed to correctly position infant at breast.     Lactation Tools Discussed/Used     Consult Status Consult Status: PRN    Dorise Bullion 10/15/2013, 3:52 PM

## 2013-10-15 NOTE — Progress Notes (Signed)
Subjective: Postpartum Day 2: Cesarean Delivery Patient reports incisional pain, tolerating PO, + flatus and no problems voiding.  Pain is improving  Objective: Vital signs in last 24 hours: Temp:  [98.1 F (36.7 C)-98.7 F (37.1 C)] 98.1 F (36.7 C) (10/23 0555) Pulse Rate:  [57-65] 57 (10/23 0555) Resp:  [18] 18 (10/23 0555) BP: (93-101)/(60-61) 93/61 mmHg (10/23 0555)  Physical Exam:  General: alert and cooperative Lochia: appropriate Uterine Fundus: firm Incision: healing well, derma bond noted DVT Evaluation: No evidence of DVT seen on physical exam. Negative Homan's sign. No cords or calf tenderness. No significant calf/ankle edema.   Recent Labs  10/13/13 0026 10/14/13 0625  HGB 11.2* 9.6*  HCT 32.0* 28.0*    Assessment/Plan: Status post Cesarean section. Doing well postoperatively.  Continue current care.  Barbara Nicholson G 10/15/2013, 9:15 AM

## 2013-10-16 MED ORDER — IBUPROFEN 600 MG PO TABS: 600.0000 mg | ORAL_TABLET | Freq: Four times a day (QID) | ORAL | Status: DC

## 2013-10-16 MED ORDER — OXYCODONE-ACETAMINOPHEN 5-325 MG PO TABS: 1.0000 | ORAL_TABLET | ORAL | Status: DC | PRN

## 2013-10-16 NOTE — Discharge Summary (Signed)
Obstetric Discharge Summary Reason for Admission: onset of labor Prenatal Procedures: ultrasound and attempted ECV Intrapartum Procedures: cesarean: low cervical, transverse Postpartum Procedures: none Complications-Operative and Postpartum: none Hemoglobin  Date Value Range Status  10/14/2013 9.6* 12.0 - 15.0 g/dL Final     HCT  Date Value Range Status  10/14/2013 28.0* 36.0 - 46.0 % Final    Physical Exam:  General: alert and cooperative Lochia: appropriate Uterine Fundus: firm Incision: healing well DVT Evaluation: No evidence of DVT seen on physical exam. Negative Homan's sign. No cords or calf tenderness. No significant calf/ankle edema.  Discharge Diagnoses: Term Pregnancy-delivered  Discharge Information: Date: 10/16/2013 Activity: pelvic rest Diet: routine Medications: PNV, Ibuprofen and Percocet Condition: stable Instructions: refer to practice specific booklet Discharge to: home   Newborn Data: Live born female  Birth Weight: 6 lb 7.9 oz (2946 g) APGAR: 9, 9  Home with mother.  Barbara Nicholson G 10/16/2013, 8:54 AM

## 2013-10-19 ENCOUNTER — Telehealth: Payer: Self-pay | Admitting: Family Medicine

## 2013-10-19 MED ORDER — AMOXICILLIN-POT CLAVULANATE 875-125 MG PO TABS: 1.0000 | ORAL_TABLET | Freq: Two times a day (BID) | ORAL | Status: DC

## 2013-10-19 NOTE — Telephone Encounter (Signed)
Pt called indicating that she now has mastitis after the birth of her child on 10/21.  She is breastfeeding.  Tm 100.3 w/ severe chills.  R sided breast pain w/ associated painful lymphadenopathy.  Pt w/o allergies.  Unable to contact GYN.  In need of abx.

## 2013-12-24 DIAGNOSIS — E538 Deficiency of other specified B group vitamins: Secondary | ICD-10-CM

## 2013-12-24 HISTORY — DX: Deficiency of other specified B group vitamins: E53.8

## 2014-03-08 ENCOUNTER — Other Ambulatory Visit: Payer: Self-pay | Admitting: Family Medicine

## 2014-03-08 NOTE — Telephone Encounter (Signed)
Med filled.  

## 2014-03-14 ENCOUNTER — Emergency Department (HOSPITAL_COMMUNITY)
Admission: EM | Admit: 2014-03-14 | Discharge: 2014-03-15 | Disposition: A | Discharge status: Home / Self Care | Payer: 59 | Attending: Emergency Medicine | Admitting: Emergency Medicine

## 2014-03-14 ENCOUNTER — Encounter (HOSPITAL_COMMUNITY): Payer: Self-pay | Admitting: Emergency Medicine

## 2014-03-14 DIAGNOSIS — Z8719 Personal history of other diseases of the digestive system: Secondary | ICD-10-CM | POA: Insufficient documentation

## 2014-03-14 DIAGNOSIS — Z3202 Encounter for pregnancy test, result negative: Secondary | ICD-10-CM | POA: Insufficient documentation

## 2014-03-14 DIAGNOSIS — R197 Diarrhea, unspecified: Secondary | ICD-10-CM | POA: Insufficient documentation

## 2014-03-14 DIAGNOSIS — R55 Syncope and collapse: Secondary | ICD-10-CM

## 2014-03-14 DIAGNOSIS — R110 Nausea: Secondary | ICD-10-CM | POA: Insufficient documentation

## 2014-03-14 HISTORY — DX: Syncope and collapse: R55

## 2014-03-14 LAB — CBC WITH DIFFERENTIAL/PLATELET
Basophils Absolute: 0 10*3/uL (ref 0.0–0.1)
Basophils Relative: 0 % (ref 0–1)
EOS PCT: 2 % (ref 0–5)
Eosinophils Absolute: 0.2 10*3/uL (ref 0.0–0.7)
HCT: 37.6 % (ref 36.0–46.0)
HEMOGLOBIN: 13.4 g/dL (ref 12.0–15.0)
LYMPHS ABS: 1.3 10*3/uL (ref 0.7–4.0)
LYMPHS PCT: 10 % — AB (ref 12–46)
MCH: 31.1 pg (ref 26.0–34.0)
MCHC: 35.6 g/dL (ref 30.0–36.0)
MCV: 87.2 fL (ref 78.0–100.0)
Monocytes Absolute: 0.7 10*3/uL (ref 0.1–1.0)
Monocytes Relative: 5 % (ref 3–12)
Neutro Abs: 10.6 10*3/uL — ABNORMAL HIGH (ref 1.7–7.7)
Neutrophils Relative %: 83 % — ABNORMAL HIGH (ref 43–77)
Platelets: 272 10*3/uL (ref 150–400)
RBC: 4.31 MIL/uL (ref 3.87–5.11)
RDW: 13.9 % (ref 11.5–15.5)
WBC: 12.8 10*3/uL — ABNORMAL HIGH (ref 4.0–10.5)

## 2014-03-14 LAB — I-STAT CHEM 8, ED
BUN: 16 mg/dL (ref 6–23)
CREATININE: 0.9 mg/dL (ref 0.50–1.10)
Calcium, Ion: 1.1 mmol/L — ABNORMAL LOW (ref 1.12–1.23)
Chloride: 105 mEq/L (ref 96–112)
Glucose, Bld: 86 mg/dL (ref 70–99)
HCT: 40 % (ref 36.0–46.0)
HEMOGLOBIN: 13.6 g/dL (ref 12.0–15.0)
POTASSIUM: 3.6 meq/L — AB (ref 3.7–5.3)
Sodium: 142 mEq/L (ref 137–147)
TCO2: 24 mmol/L (ref 0–100)

## 2014-03-14 MED ORDER — SODIUM CHLORIDE 0.9 % IV BOLUS (SEPSIS)
1000.0000 mL | Freq: Once | INTRAVENOUS | Status: AC
  Administered 2014-03-14: 1000 mL via INTRAVENOUS

## 2014-03-14 MED ORDER — ONDANSETRON HCL 4 MG/2ML IJ SOLN
4.0000 mg | Freq: Once | INTRAMUSCULAR | Status: AC
  Administered 2014-03-14: 4 mg via INTRAVENOUS
  Filled 2014-03-14: qty 2

## 2014-03-15 LAB — POC URINE PREG, ED: Preg Test, Ur: NEGATIVE

## 2014-03-15 MED ORDER — SODIUM CHLORIDE 0.9 % IV BOLUS (SEPSIS)
1000.0000 mL | Freq: Once | INTRAVENOUS | Status: AC
  Administered 2014-03-15: 1000 mL via INTRAVENOUS

## 2014-03-15 MED ORDER — METOCLOPRAMIDE HCL 5 MG/ML IJ SOLN: 10.0000 mg | Freq: Once | INTRAMUSCULAR | Status: DC

## 2014-03-15 MED ORDER — PROMETHAZINE HCL 25 MG PO TABS: 25.0000 mg | ORAL_TABLET | Freq: Four times a day (QID) | ORAL | Status: DC | PRN

## 2014-03-15 MED ORDER — PROMETHAZINE HCL 25 MG/ML IJ SOLN
12.5000 mg | Freq: Once | INTRAMUSCULAR | Status: AC
  Administered 2014-03-15: 12.5 mg via INTRAVENOUS
  Filled 2014-03-15: qty 1

## 2014-03-15 NOTE — ED Provider Notes (Signed)
CSN: [id number]     Arrival date & time 03/14/14  2318 History   First MD Initiated Contact with Patient 03/14/14 2346     Chief Complaint  Patient presents with  . Loss of Consciousness   HPI  History provided by the patient. Patient is a 38 year old female with history of ulcerative colitis who presents after a syncopal episode. Patient states that she was getting up early this morning to let her dog outside when she suddenly had to rush back inside and had a "explosive episode of diarrhea". Patient states that she suddenly had slight sweats and chills following this episode and as she was standing up felt slightly lightheaded. She did have a brief syncopal episode lasting 10-15 seconds. Patient reportedly fell to her husband's arms. Denies any injuries from syncopal episode. Does report that she may have been feeling short of breath she regained consciousness but states she may have been hyperventilating. Since that time she has continued to feel nauseated without any episodes of vomiting. 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 No other specific sick contacts. Denies any fevers recent cough congestion. Denies any associated chest pain or heart palpitations. No recent extremity swelling. No prior history of blood clots. No estrogen or birth control use. No prior history of cardiac conditions.   Past Medical History  Diagnosis Date  . Hx of ulcerative colitis   . AMA (advanced maternal age) multigravida 35+    Past Surgical History  Procedure Laterality Date  . Uterine septum resection    . Wisdom tooth extraction    . Cesarean section N/A 10/13/2013    Procedure: Primary Cesarean Section Delivery Baby  Girl  @ 0122, Apgars 9/9   ;  Surgeon: Cyril Mourning, MD;  Location: [facility];  Service: Obstetrics;  Laterality: N/A;   Family History  Problem Relation Age of Onset  . Mesothelioma Mother    History   Substance Use Topics  . Smoking status: Never Smoker   . Smokeless tobacco: Not on file  . Alcohol Use: Yes     Comment: Socially    OB History   Grav Para Term Preterm Abortions TAB SAB Ect Mult Living   5 2 1  3  3   2      Review of Systems  Constitutional: Negative for fever.  Cardiovascular: Negative for chest pain, palpitations and leg swelling.  Gastrointestinal: Positive for nausea and diarrhea. Negative for vomiting, abdominal pain and blood in stool.  Neurological: Positive for syncope and light-headedness.  All other systems reviewed and are negative.      Allergies  Review of patient's allergies indicates no known allergies.  Home Medications   Current Outpatient Rx  Name  Route  Sig  Dispense  Refill  . ibuprofen (ADVIL,MOTRIN) 200 MG tablet   Oral   Take 600-800 mg by mouth every 6 (six) hours as needed for mild pain.         . naproxen sodium (ANAPROX) 220 MG tablet   Oral   Take 40 mg by mouth daily as needed (for pain).         . promethazine (PHENERGAN) 25 MG tablet   Oral   Take 1 tablet (25 mg total) by mouth every 6 (six) hours as needed for nausea.   20 tablet   0    BP 103/56  Pulse 60  Temp(Src) 97.4 F (36.3 C) (Oral)  Resp  15  SpO2 100%  LMP 02/28/2014  Breastfeeding? No Physical Exam  Nursing note and vitals reviewed. Constitutional: She is oriented to person, place, and time. She appears well-developed and well-nourished. No distress.  HENT:  Head: Normocephalic.  Cardiovascular: Normal rate and regular rhythm.   Pulmonary/Chest: Effort normal and breath sounds normal. No respiratory distress. She has no wheezes. She has no rales.  Abdominal: Soft. She exhibits no distension. There is no tenderness. There is no rebound and no guarding.  Musculoskeletal: Normal range of motion. She exhibits no edema.  Neurological: She is alert and oriented to person, place, and time.  Skin: Skin is warm and dry. No rash noted.   Psychiatric: She has a normal mood and affect. Her behavior is normal.    ED Course  Procedures   DIAGNOSTIC STUDIES: Oxygen Saturation is 100% on room air.    COORDINATION OF CARE:  Nursing notes reviewed. Vital signs reviewed. Initial pt interview and examination performed.   12:00 AM patient seen and evaluated. Patient resting appears comfortable no acute distress. Does continue to feel nauseous. I reviewed and discussed the lab test findings with the patient. I also reviewed her EKG which appears normal in concerning today. Patient does have slight orthostatic vital signs with increased heart rate from 72-90. No significant changes in blood pressure. Her symptoms are consistent with a vasovagal episode of syncope. She is otherwise healthy with no significant cardiac history. Abdomen is currently soft without significant tenderness. Normal H&H.  1:30 AM patient has received 2 L of IV fluids. She is feeling improved tolerating by mouth fluids. She has been up ambulating without assistance to the restroom. At this time she is ready to return home.   Treatment plan initiated: Medications  sodium chloride 0.9 % bolus 1,000 mL (0 mLs Intravenous Stopped 03/15/14 0036)  ondansetron (ZOFRAN) injection 4 mg (4 mg Intravenous Given 03/14/14 2357)  promethazine (PHENERGAN) injection 12.5 mg (12.5 mg Intravenous Given 03/15/14 0043)  sodium chloride 0.9 % bolus 1,000 mL (0 mLs Intravenous Stopped 03/15/14 0143)   Results for orders placed during the hospital encounter of 03/14/14  CBC WITH DIFFERENTIAL      Result Value Ref Range   WBC 12.8 (*) 4.0 - 10.5 K/uL   RBC 4.31  3.87 - 5.11 MIL/uL   Hemoglobin 13.4  12.0 - 15.0 g/dL   HCT 37.6  36.0 - 46.0 %   MCV 87.2  78.0 - 100.0 fL   MCH 31.1  26.0 - 34.0 pg   MCHC 35.6  30.0 - 36.0 g/dL   RDW 13.9  11.5 - 15.5 %   Platelets 272  150 - 400 K/uL   Neutrophils Relative % 83 (*) 43 - 77 %   Neutro Abs 10.6 (*) 1.7 - 7.7 K/uL   Lymphocytes  Relative 10 (*) 12 - 46 %   Lymphs Abs 1.3  0.7 - 4.0 K/uL   Monocytes Relative 5  3 - 12 %   Monocytes Absolute 0.7  0.1 - 1.0 K/uL   Eosinophils Relative 2  0 - 5 %   Eosinophils Absolute 0.2  0.0 - 0.7 K/uL   Basophils Relative 0  0 - 1 %   Basophils Absolute 0.0  0.0 - 0.1 K/uL  POC URINE PREG, ED      Result Value Ref Range   Preg Test, Ur NEGATIVE  NEGATIVE  I-STAT CHEM 8, ED      Result Value Ref Range   Sodium 142  137 - 147 mEq/L   Potassium 3.6 (*) 3.7 - 5.3 mEq/L   Chloride 105  96 - 112 mEq/L   BUN 16  6 - 23 mg/dL   Creatinine, Ser 0.90  0.50 - 1.10 mg/dL   Glucose, Bld 86  70 - 99 mg/dL   Calcium, Ion 1.10 (*) 1.12 - 1.23 mmol/L   TCO2 24  0 - 100 mmol/L   Hemoglobin 13.6  12.0 - 15.0 g/dL   HCT 40.0  36.0 - 46.0 %      EKG Interpretation   Date/Time:  Sunday March 14 2014 23:30:12 EDT Ventricular Rate:  68 PR Interval:  144 QRS Duration: 108 QT Interval:  470 QTC Calculation: 500 R Axis:   63 Text Interpretation:  Sinus rhythm Confirmed by [facility]  MD, APRIL  ([id number]) on 03/14/2014 11:34:20 PM      MDM   Final diagnoses:  Syncope, vasovagal  Diarrhea        Martie Lee, PA-C 03/15/14 [phone]

## 2014-03-15 NOTE — ED Notes (Signed)
PA at bedside.

## 2014-03-15 NOTE — Discharge Instructions (Signed)
You were seen and evaluated for your diarrhea and syncopal episode.  Your lab testing and EKG of your heart have not shown any concerning findings.  At this time your provider(s) do not feel your symptoms are not caused by any emergent condition.  Continue to drink plenty of fluids.  Follow up with your primary care provider.  Return for any changing or worsening symptoms.     Vasovagal Syncope, Adult Syncope, commonly known as fainting, is a temporary loss of consciousness. It occurs when the blood flow to the brain is reduced. Vasovagal syncope (also called neurocardiogenic syncope) is a fainting spell in which the blood flow to the brain is reduced because of a sudden drop in heart rate and blood pressure. Vasovagal syncope occurs when the brain and the cardiovascular system (blood vessels) do not adequately communicate and respond to each other. This is the most common cause of fainting. It often occurs in response to fear or some other type of emotional or physical stress. The body has a reaction in which the heart starts beating too slowly or the blood vessels expand, reducing blood pressure. This type of fainting spell is generally considered harmless. However, injuries can occur if a person takes a sudden fall during a fainting spell.  CAUSES  Vasovagal syncope occurs when a person's blood pressure and heart rate decrease suddenly, usually in response to a trigger. Many things and situations can trigger an episode. Some of these include:   Pain.   Fear.   The sight of blood or medical procedures, such as blood being drawn from a vein.   Common activities, such as coughing, swallowing, stretching, or going to the bathroom.   Emotional stress.   Prolonged standing, especially in a warm environment.   Lack of sleep or rest.   Prolonged lack of food.   Prolonged lack of fluids.   Recent illness.  The use of certain drugs that affect blood pressure, such as cocaine, alcohol,  marijuana, inhalants, and opiates.  SYMPTOMS  Before the fainting episode, you may:   Feel dizzy or light headed.   Become pale.  Sense that you are going to faint.   Feel like the room is spinning.   Have tunnel vision, only seeing directly in front of you.   Feel sick to your stomach (nauseous).   See spots or slowly lose vision.   Hear ringing in your ears.   Have a headache.   Feel warm and sweaty.   Feel a sensation of pins and needles. During the fainting spell, you will generally be unconscious for no longer than a couple minutes before waking up and returning to normal. If you get up too quickly before your body can recover, you may faint again. Some twitching or jerky movements may occur during the fainting spell.  DIAGNOSIS  Your caregiver will ask about your symptoms, take a medical history, and perform a physical exam. Various tests may be done to rule out other causes of fainting. These may include blood tests and tests to check the heart, such as electrocardiography, echocardiography, and possibly an electrophysiology study. When other causes have been ruled out, a test may be done to check the body's response to changes in position (tilt table test). TREATMENT  Most cases of vasovagal syncope do not require treatment. Your caregiver may recommend ways to avoid fainting triggers and may provide home strategies for preventing fainting. If you must be exposed to a possible trigger, you can drink additional fluids  to help reduce your chances of having an episode of vasovagal syncope. If you have warning signs of an oncoming episode, you can respond by positioning yourself favorably (lying down). If your fainting spells continue, you may be given medicines to prevent fainting. Some medicines may help make you more resistant to repeated episodes of vasovagal syncope. Special exercises or compression stockings may be recommended. In rare cases, the surgical placement  of a pacemaker is considered. HOME CARE INSTRUCTIONS   Learn to identify the warning signs of vasovagal syncope.   Sit or lie down at the first warning sign of a fainting spell. If sitting, put your head down between your legs. If you lie down, swing your legs up in the air to increase blood flow to the brain.   Avoid hot tubs and saunas.  Avoid prolonged standing.  Drink enough fluids to keep your urine clear or pale yellow. Avoid caffeine.  Increase salt in your diet as directed by your caregiver.   If you have to stand for a long time, perform movements such as:   Crossing your legs.   Flexing and stretching your leg muscles.   Squatting.   Moving your legs.   Bending over.   Only take over-the-counter or prescription medicines as directed by your caregiver. Do not suddenly stop any medicines without asking your caregiver first. SEEK MEDICAL CARE IF:   Your fainting spells continue or happen more frequently in spite of treatment.   You lose consciousness for more than a couple minutes.  You have fainting spells during or after exercising or after being startled.   You have new symptoms that occur with the fainting spells, such as:   Shortness of breath.  Chest pain.   Irregular heartbeat.   You have episodes of twitching or jerky movements that last longer than a few seconds.  You have episodes of twitching or jerky movements without obvious fainting. SEEK IMMEDIATE MEDICAL CARE IF:   You have injuries or bleeding after a fainting spell.   You have episodes of twitching or jerky movements that last longer than 5 minutes.   You have more than one spell of twitching or jerky movements before returning to consciousness after fainting. MAKE SURE YOU:   Understand these instructions.  Will watch your condition.  Will get help right away if you are not doing well or get worse. Document Released: 11/26/2012 Document Reviewed:  11/26/2012 [facility] Patient Information 2014 [place], [place].   Diarrhea Diarrhea is watery poop (stool). It can make you feel weak, tired, thirsty, or give you a dry mouth (signs of dehydration). Watery poop is a sign of another problem, most often an infection. It often lasts 2 3 days. It can last longer if it is a sign of something serious. Take care of yourself as told by your doctor. HOME CARE   Drink 1 cup (8 ounces) of fluid each time you have watery poop.  Do not drink the following fluids:  Those that contain simple sugars (fructose, glucose, galactose, lactose, sucrose, maltose).  Sports drinks.  Fruit juices.  Whole milk products.  Sodas.  Drinks with caffeine (coffee, tea, soda) or alcohol.  Oral rehydration solution may be used if the doctor says it is okay. You may make your own solution. Follow this recipe:    teaspoon table salt.   teaspoon baking soda.   teaspoon salt substitute containing potassium chloride.  1 tablespoons sugar.  1 liter (34 ounces) of water.  Avoid the following  foods:  High fiber foods, such as raw fruits and vegetables.  Nuts, seeds, and whole grain breads and cereals.   Those that are sweetened with sugar alcohols (xylitol, sorbitol, mannitol).  Try eating the following foods:  Starchy foods, such as rice, toast, pasta, low-sugar cereal, oatmeal, baked potatoes, crackers, and bagels.  Bananas.  Applesauce.  Eat probiotic-rich foods, such as yogurt and milk products that are fermented.  Wash your hands well after each time you have watery poop.  Only take medicine as told by your doctor.  Take a warm bath to help lessen burning or pain from having watery poop. GET HELP RIGHT AWAY IF:   You cannot drink fluids without throwing up (vomiting).  You keep throwing up.  You have blood in your poop, or your poop looks black and tarry.  You do not pee (urinate) in 6 8 hours, or there is only a small amount of very dark  pee.  You have belly (abdominal) pain that gets worse or stays in the same spot (localizes).  You are weak, dizzy, confused, or lightheaded.  You have a very bad headache.  Your watery poop gets worse or does not get better.  You have a fever or lasting symptoms for more than 2 3 days.  You have a fever and your symptoms suddenly get worse. MAKE SURE YOU:   Understand these instructions.  Will watch your condition.  Will get help right away if you are not doing well or get worse. Document Released: 05/28/2008 Document Revised: 09/03/2012 Document Reviewed: 08/17/2012 [facility] Patient Information 2014 [place], [place].

## 2014-03-15 NOTE — ED Provider Notes (Signed)
Medical screening examination/treatment/procedure(s) were performed by non-physician practitioner and as supervising physician I was immediately available for consultation/collaboration.   EKG Interpretation   Date/Time:  Sunday March 14 2014 23:30:12 EDT Ventricular Rate:  68 PR Interval:  144 QRS Duration: 108 QT Interval:  470 QTC Calculation: 500 R Axis:   63 Text Interpretation:  Sinus rhythm Confirmed by [facility]  MD, Saleen Peden  ([id number]) on 03/14/2014 11:34:20 PM       Barbara Nicholson Patten, MD 03/15/14 [phone]

## 2014-03-29 ENCOUNTER — Other Ambulatory Visit: Payer: Self-pay | Admitting: Family Medicine

## 2014-03-29 DIAGNOSIS — R7989 Other specified abnormal findings of blood chemistry: Secondary | ICD-10-CM

## 2014-04-20 ENCOUNTER — Encounter: Payer: Self-pay | Admitting: Internal Medicine

## 2014-04-20 ENCOUNTER — Ambulatory Visit (INDEPENDENT_AMBULATORY_CARE_PROVIDER_SITE_OTHER): Payer: 59 | Admitting: Internal Medicine

## 2014-04-20 VITALS — BP 102/58 | HR 57 | Temp 97.6°F | Resp 12 | Ht 64.25 in | Wt 122.6 lb

## 2014-04-20 DIAGNOSIS — E538 Deficiency of other specified B group vitamins: Secondary | ICD-10-CM

## 2014-04-20 DIAGNOSIS — L659 Nonscarring hair loss, unspecified: Secondary | ICD-10-CM

## 2014-04-20 DIAGNOSIS — R7989 Other specified abnormal findings of blood chemistry: Secondary | ICD-10-CM

## 2014-04-20 DIAGNOSIS — R946 Abnormal results of thyroid function studies: Secondary | ICD-10-CM

## 2014-04-20 DIAGNOSIS — R790 Abnormal level of blood mineral: Secondary | ICD-10-CM

## 2014-04-20 DIAGNOSIS — R799 Abnormal finding of blood chemistry, unspecified: Secondary | ICD-10-CM

## 2014-04-20 DIAGNOSIS — E0590 Thyrotoxicosis, unspecified without thyrotoxic crisis or storm: Secondary | ICD-10-CM | POA: Insufficient documentation

## 2014-04-20 LAB — TSH: TSH: 0.35 u[IU]/mL (ref 0.35–5.50)

## 2014-04-20 LAB — T4, FREE: FREE T4: 0.81 ng/dL (ref 0.60–1.60)

## 2014-04-20 LAB — FERRITIN: Ferritin: 19.3 ng/mL (ref 10.0–291.0)

## 2014-04-20 LAB — T3, FREE: T3 FREE: 2.7 pg/mL (ref 2.3–4.2)

## 2014-04-20 LAB — VITAMIN B12: Vitamin B-12: 259 pg/mL (ref 211–911)

## 2014-04-20 NOTE — Patient Instructions (Signed)
Please stop at the lab. We will make another appointment if labs are abnormal.

## 2014-04-20 NOTE — Progress Notes (Signed)
Patient ID: Barbara Nicholson, female   DOB: 1976-09-12, 38 y.o.   MRN: [id number]   HPI  Barbara Nicholson is a 38 y.o.-year-old female, referred by her PCP, Dr. Birdie Riddle, for evaluation for low TSH.  Pt;s TSH was found to be low on a routine employee check in 02/2014: TSH 0.2 (per pt report, I do not have the exact record).  Pt has a 75 month old daughter. A TSH checked before pregnancy was normal. Per our records, another TSH level was normal in 2013:  Lab Results  Component Value Date   TSH 1.02 05/13/2012   Pt denies feeling nodules in neck, hoarseness, dysphagia/odynophagia, SOB with lying down; she c/o: - hair thinning - after her pregnancy - no excessive sweating/heat intolerance - no tremors - no anxiety - no palpitations - no fatigue - no hyperdefecation - weight loss: 30 lbs gained with pregnancy >> lost all of them after she gave birth.  Pt does not have a FH of thyroid ds. No FH of thyroid cancer. No h/o radiation tx to head or neck.  No seaweed or kelp, no recent contrast studies. No steroid use. She has UC, but is not on Prednisone. No herbal supplements. No weight loss supplements.  ROS: Constitutional: + weight gain/loss - with pregnancy, no fatigue, no subjective hyperthermia/hypothermia Eyes: no blurry vision, no xerophthalmia ENT: no sore throat, no nodules palpated in throat, no dysphagia/odynophagia, no hoarseness Cardiovascular: no CP/SOB/palpitations/leg swelling Respiratory: no cough/SOB Gastrointestinal: no N/V/D/C Musculoskeletal: no muscle/joint aches Skin: no rashes, + hair thinning Neurological: no tremors/numbness/tingling/dizziness Psychiatric: no depression/anxiety  Past Medical History  Diagnosis Date  . Hx of ulcerative colitis   . AMA (advanced maternal age) multigravida 35+    Past Surgical History  Procedure Laterality Date  . Uterine septum resection    . Wisdom tooth extraction    . Cesarean section N/A 10/13/2013    Procedure: Primary  Cesarean Section Delivery Baby  Girl  @ 0122, Apgars 9/9   ;  Surgeon: Cyril Mourning, MD;  Location: [facility];  Service: Obstetrics;  Laterality: N/A;   History   Social History  . Marital Status: Married    Spouse Name: N/A    Number of Children: 2: 20 y/o and 38 mo old   Occupational History  . MD   Social History Main Topics  . Smoking status: Never Smoker   . Smokeless tobacco: Not on file  . Alcohol Use: Yes     Comment: Socially   . Drug Use: No   Current Outpatient Prescriptions on File Prior to Visit  Medication Sig Dispense Refill  . ibuprofen (ADVIL,MOTRIN) 200 MG tablet Take 600-800 mg by mouth every 6 (six) hours as needed for mild pain.      . naproxen sodium (ANAPROX) 220 MG tablet Take 40 mg by mouth daily as needed (for pain).      . promethazine (PHENERGAN) 25 MG tablet Take 1 tablet (25 mg total) by mouth every 6 (six) hours as needed for nausea.  20 tablet  0   No current facility-administered medications on file prior to visit.   No Known Allergies Family History  Problem Relation Age of Onset  . Mesothelioma Mother    PE: BP 102/58  Pulse 57  Temp(Src) 97.6 F (36.4 C) (Oral)  Resp 12  Ht 5' 4.25" (1.632 m)  Wt 122 lb 9.6 oz (55.611 kg)  BMI 20.88 kg/m2  SpO2 97% Wt Readings from Last 3 Encounters:  04/20/14 122 lb 9.6 oz (55.611 kg)  10/12/13 147 lb 6 oz (66.849 kg)  10/12/13 147 lb 6 oz (66.849 kg)   Constitutional: normal weight, in NAD Eyes: PERRLA, EOMI, no exophthalmos, no lid lag, no stare ENT: moist mucous membranes, no thyromegaly, no thyroid bruits, no cervical lymphadenopathy Cardiovascular: RRR, No MRG Respiratory: CTA B Gastrointestinal: abdomen soft, NT, ND, BS+ Musculoskeletal: no deformities, strength intact in all 4 Skin: moist, warm, no rashes Neurological: no tremor with outstretched hands, DTR normal in all 4  ASSESSMENT: 1. Low TSH  2. Hair thinning  PLAN:  1. Patient with a recently found low TSH, without any  thyrotoxic sxs: weight loss, heat intolerance, hyperdefecation, palpitations, anxiety.  - she does not appear to have exogenous causes for the low TSH.  - We discussed that possible causes of thyrotoxicosis are:  Graves ds   Thyroiditis toxic multinodular goiter/ toxic adenoma (I cannot feel nodules at palpation of his thyroid). - I suggested that we check the TSH, fT3 and fT4 and make sure the previous reading was not a lab error  - If the tests remain abnormal, we may either follow them or check an uptake and scan to differentiate between the 3 above possible etiologies  - RTC if the labs are abnormal; if normal, she can have labs through PCP every year. I advised her to let me or PCP know if she becomes symptomatic with sxs of either hypo/hyperthyroidism, in this case, we may need a repeat TFT check sooner  2. Hair thinning - most likely related to postpartum state - check TFTs - as above - check ferritin - of note, no anemia per recent labs - check vitamin B12  Office Visit on 04/20/2014  Component Date Value Ref Range Status  . TSH 04/20/2014 0.35  0.35 - 5.50 uIU/mL Final  . Free T4 04/20/2014 0.81  0.60 - 1.60 ng/dL Final  . T3, Free 04/20/2014 2.7  2.3 - 4.2 pg/mL Final  . Vitamin B-12 04/20/2014 259  211 - 911 pg/mL Final  . Ferritin 04/20/2014 19.3  10.0 - 291.0 ng/mL Final   Labs printed and given to the pt. Discussed about B12 injections and also iron supplementation. Since TSH borderline, will repeat TFTs in 2 mo.

## 2014-04-21 ENCOUNTER — Ambulatory Visit (INDEPENDENT_AMBULATORY_CARE_PROVIDER_SITE_OTHER): Payer: 59 | Admitting: *Deleted

## 2014-04-21 DIAGNOSIS — E538 Deficiency of other specified B group vitamins: Secondary | ICD-10-CM

## 2014-04-21 DIAGNOSIS — L659 Nonscarring hair loss, unspecified: Secondary | ICD-10-CM | POA: Insufficient documentation

## 2014-04-21 DIAGNOSIS — R790 Abnormal level of blood mineral: Secondary | ICD-10-CM | POA: Insufficient documentation

## 2014-04-21 MED ORDER — CYANOCOBALAMIN 1000 MCG/ML IJ SOLN: 1000.0000 ug | INTRAMUSCULAR | Status: AC

## 2014-04-21 MED ORDER — CYANOCOBALAMIN 1000 MCG/ML IJ SOLN
1000.0000 ug | Freq: Once | INTRAMUSCULAR | Status: AC
  Administered 2014-04-21: 1000 ug via INTRAMUSCULAR

## 2014-05-06 ENCOUNTER — Telehealth: Payer: Self-pay | Admitting: Family Medicine

## 2014-05-06 MED ORDER — POLYMYXIN B-TRIMETHOPRIM 10000-0.1 UNIT/ML-% OP SOLN: OPHTHALMIC | Status: DC

## 2014-05-06 NOTE — Telephone Encounter (Signed)
Pt called today indicating that she has a likely corneal abrasion after falling asleep w/ contact in.  Eye is red and watery but no blurry vision or severe photophobia.  Will send abx drop w/ instructions to call ophthalmology if sxs change or worsen.  Pt expressed understanding and is in agreement w/ plan.

## 2014-05-21 ENCOUNTER — Ambulatory Visit (INDEPENDENT_AMBULATORY_CARE_PROVIDER_SITE_OTHER): Payer: 59 | Admitting: *Deleted

## 2014-05-21 DIAGNOSIS — E538 Deficiency of other specified B group vitamins: Secondary | ICD-10-CM

## 2014-05-21 MED ORDER — CYANOCOBALAMIN 1000 MCG/ML IJ SOLN
1000.0000 ug | Freq: Once | INTRAMUSCULAR | Status: AC
  Administered 2014-05-21: 1000 ug via INTRAMUSCULAR

## 2014-06-18 ENCOUNTER — Other Ambulatory Visit: Payer: Self-pay | Admitting: General Practice

## 2014-06-18 MED ORDER — IVERMECTIN 0.5 % EX LOTN: TOPICAL_LOTION | CUTANEOUS | Status: DC

## 2014-06-23 ENCOUNTER — Other Ambulatory Visit (INDEPENDENT_AMBULATORY_CARE_PROVIDER_SITE_OTHER): Payer: 59

## 2014-06-23 ENCOUNTER — Ambulatory Visit (INDEPENDENT_AMBULATORY_CARE_PROVIDER_SITE_OTHER): Payer: 59 | Admitting: *Deleted

## 2014-06-23 DIAGNOSIS — R7989 Other specified abnormal findings of blood chemistry: Secondary | ICD-10-CM

## 2014-06-23 DIAGNOSIS — R946 Abnormal results of thyroid function studies: Secondary | ICD-10-CM

## 2014-06-23 DIAGNOSIS — E538 Deficiency of other specified B group vitamins: Secondary | ICD-10-CM

## 2014-06-23 LAB — T4, FREE: Free T4: 0.82 ng/dL (ref 0.60–1.60)

## 2014-06-23 LAB — TSH: TSH: 0.28 u[IU]/mL — AB (ref 0.35–4.50)

## 2014-06-23 LAB — T3, FREE: T3, Free: 2.3 pg/mL (ref 2.3–4.2)

## 2014-06-23 MED ORDER — CYANOCOBALAMIN 1000 MCG/ML IJ SOLN
1000.0000 ug | Freq: Once | INTRAMUSCULAR | Status: AC
  Administered 2014-06-23: 1000 ug via INTRAMUSCULAR

## 2014-06-28 ENCOUNTER — Other Ambulatory Visit: Payer: Self-pay | Admitting: Internal Medicine

## 2014-06-28 DIAGNOSIS — E0590 Thyrotoxicosis, unspecified without thyrotoxic crisis or storm: Secondary | ICD-10-CM

## 2014-06-28 NOTE — Addendum Note (Signed)
Addended by: Philemon Kingdom on: 06/28/2014 12:43 PM   Modules accepted: Orders

## 2014-06-28 NOTE — Progress Notes (Signed)
Lab Results  Component Value Date   TSH 0.28* 06/23/2014   TSH 0.35 04/20/2014   TSH 1.02 05/13/2012   FREET4 0.82 06/23/2014   FREET4 0.81 04/20/2014   TSH again low (pt has another low TSH - not in the system).   Pt's sxs: hair loss. Will perform an uptake and scan.

## 2014-07-19 ENCOUNTER — Encounter (HOSPITAL_COMMUNITY)
Admission: RE | Admit: 2014-07-19 | Discharge: 2014-07-19 | Disposition: A | Discharge status: Home / Self Care | Payer: 59 | Source: Ambulatory Visit | Attending: Internal Medicine | Admitting: Internal Medicine

## 2014-07-19 DIAGNOSIS — E0590 Thyrotoxicosis, unspecified without thyrotoxic crisis or storm: Secondary | ICD-10-CM | POA: Insufficient documentation

## 2014-07-20 ENCOUNTER — Encounter (HOSPITAL_COMMUNITY)
Admission: RE | Admit: 2014-07-20 | Discharge: 2014-07-20 | Disposition: A | Discharge status: Home / Self Care | Payer: 59 | Source: Ambulatory Visit | Attending: Internal Medicine | Admitting: Internal Medicine

## 2014-07-20 DIAGNOSIS — E0590 Thyrotoxicosis, unspecified without thyrotoxic crisis or storm: Secondary | ICD-10-CM | POA: Diagnosis present

## 2014-07-20 MED ORDER — SODIUM PERTECHNETATE TC 99M INJECTION
10.1000 | Freq: Once | INTRAVENOUS | Status: AC | PRN
  Administered 2014-07-20: 10.1 via INTRAVENOUS

## 2014-07-20 MED ORDER — SODIUM IODIDE I 131 CAPSULE
8.3000 | Freq: Once | INTRAVENOUS | Status: AC | PRN
  Administered 2014-07-20: 8.3 via ORAL

## 2014-07-21 ENCOUNTER — Ambulatory Visit (INDEPENDENT_AMBULATORY_CARE_PROVIDER_SITE_OTHER): Payer: 59 | Admitting: *Deleted

## 2014-07-21 DIAGNOSIS — E538 Deficiency of other specified B group vitamins: Secondary | ICD-10-CM

## 2014-07-21 MED ORDER — CYANOCOBALAMIN 1000 MCG/ML IJ SOLN
1000.0000 ug | Freq: Once | INTRAMUSCULAR | Status: AC
  Administered 2014-07-21: 1000 ug via INTRAMUSCULAR

## 2014-07-21 MED ORDER — CYANOCOBALAMIN 1000 MCG/ML IJ SOLN: 1000.0000 ug | Freq: Once | INTRAMUSCULAR | Status: DC

## 2014-07-30 ENCOUNTER — Telehealth: Payer: Self-pay | Admitting: Family Medicine

## 2014-07-30 MED ORDER — BUPROPION HCL ER (XL) 150 MG PO TB24: 150.0000 mg | ORAL_TABLET | Freq: Every day | ORAL | Status: DC

## 2014-07-30 NOTE — Telephone Encounter (Signed)
Pt called asking to restart Wellbutrin.  She was on this medication when her mother was dying and did well- no side effects.  Pt is again struggling w/ depressive symptoms.  Is not breast feeding.  Script sent to pharmacy.

## 2014-08-13 ENCOUNTER — Telehealth: Payer: Self-pay | Admitting: Family Medicine

## 2014-08-13 MED ORDER — BUPROPION HCL ER (XL) 300 MG PO TB24: 300.0000 mg | ORAL_TABLET | Freq: Every day | ORAL | Status: DC

## 2014-08-13 NOTE — Telephone Encounter (Signed)
Pt called indicating that Wellbutrin 156m is somewhat helpful and would like to increase to 3066mdaily.  New script sent.

## 2014-09-03 LAB — HM COLONOSCOPY: HM Colonoscopy: NORMAL

## 2014-09-03 LAB — HM SIGMOIDOSCOPY: HM SIGMOIDOSCOPY: NORMAL

## 2014-09-07 ENCOUNTER — Ambulatory Visit (INDEPENDENT_AMBULATORY_CARE_PROVIDER_SITE_OTHER): Payer: 59 | Admitting: *Deleted

## 2014-09-07 DIAGNOSIS — E538 Deficiency of other specified B group vitamins: Secondary | ICD-10-CM

## 2014-09-07 MED ORDER — CYANOCOBALAMIN 1000 MCG/ML IJ SOLN
1000.0000 ug | Freq: Once | INTRAMUSCULAR | Status: AC
  Administered 2014-09-07: 1000 ug via INTRAMUSCULAR

## 2014-09-10 ENCOUNTER — Other Ambulatory Visit (INDEPENDENT_AMBULATORY_CARE_PROVIDER_SITE_OTHER): Payer: 59

## 2014-09-10 DIAGNOSIS — E0590 Thyrotoxicosis, unspecified without thyrotoxic crisis or storm: Secondary | ICD-10-CM

## 2014-09-10 LAB — TSH: TSH: 0.96 u[IU]/mL (ref 0.35–4.50)

## 2014-09-10 LAB — T3, FREE: T3 FREE: 2.9 pg/mL (ref 2.3–4.2)

## 2014-09-10 LAB — T4, FREE: Free T4: 0.91 ng/dL (ref 0.60–1.60)

## 2014-09-27 ENCOUNTER — Encounter: Payer: Self-pay | Admitting: General Practice

## 2014-10-25 ENCOUNTER — Encounter: Payer: Self-pay | Admitting: Internal Medicine

## 2015-01-03 ENCOUNTER — Telehealth: Payer: Self-pay | Admitting: Family Medicine

## 2015-01-03 MED ORDER — PROMETHAZINE-DM 6.25-15 MG/5ML PO SYRP
5.0000 mL | ORAL_SOLUTION | Freq: Four times a day (QID) | ORAL | Status: DC | PRN
Start: 2015-01-03 — End: 2018-09-10

## 2015-01-03 MED ORDER — AZITHROMYCIN 250 MG PO TABS: ORAL_TABLET | ORAL | Status: DC

## 2015-01-03 NOTE — Telephone Encounter (Signed)
Pt called today indicating 'I have had a productive cough for weeks, now I am wheezing/lungs whistle from across the room and had a fever this morning'.  Based on this and her known exposure to sick contacts, will send abx to pharmacy.

## 2015-01-21 ENCOUNTER — Other Ambulatory Visit: Payer: Self-pay | Admitting: Family Medicine

## 2015-01-21 MED ORDER — BUPROPION HCL ER (XL) 300 MG PO TB24: 300.0000 mg | ORAL_TABLET | Freq: Every day | ORAL | Status: DC

## 2015-09-12 ENCOUNTER — Telehealth: Payer: Self-pay | Admitting: Family Medicine

## 2015-09-12 MED ORDER — FLUCONAZOLE 150 MG PO TABS: ORAL_TABLET | ORAL | Status: DC

## 2015-09-12 NOTE — Telephone Encounter (Signed)
Classic tinea versicolor on exam.  Rx with diflucan protocol.  Electronically Signed  By: Owens Loffler, MD On: 09/12/2015 12:03 PM

## 2016-07-24 ENCOUNTER — Encounter: Payer: Self-pay | Admitting: Family Medicine

## 2017-01-01 ENCOUNTER — Telehealth: Payer: Self-pay | Admitting: Family Medicine

## 2017-01-01 MED ORDER — PREDNISONE 20 MG PO TABS: ORAL_TABLET | ORAL | 3 refills | Status: DC

## 2017-01-01 NOTE — Telephone Encounter (Signed)
Note from pt- I've been ok other than my UC flares. Saw buccini and had a colonoscopy a few months ago. He thought my recent races were making it worse and I think he was right. I've been doing ok until 2 days ago. Bad flare and out of prednisone. Not sure if he is away or what but no answer from his office. Would you mind terribly refilling my prednsione 20 mg- 1-3 tablets daily as needed for a flare, #30? So sorry to ask!!!   Will refill medication for pt.

## 2017-01-07 ENCOUNTER — Telehealth: Payer: Self-pay | Admitting: Family Medicine

## 2017-01-07 MED ORDER — OSELTAMIVIR PHOSPHATE 75 MG PO CAPS: 75.0000 mg | ORAL_CAPSULE | Freq: Every day | ORAL | 0 refills | Status: DC

## 2017-01-07 NOTE — Telephone Encounter (Signed)
Verbal communication, husband with 103 temp, influenza.   Tamiflu proph  Signed,  Frederico Hamman T. Jamylah Marinaccio, MD

## 2017-07-15 ENCOUNTER — Other Ambulatory Visit: Payer: Self-pay | Admitting: Family Medicine

## 2017-07-15 MED ORDER — PREDNISONE 20 MG PO TABS: ORAL_TABLET | ORAL | 3 refills | Status: DC

## 2017-07-15 NOTE — Progress Notes (Signed)
Pt called needing refill of Prednisone for UC flare.  Prescription sent along w/ instructions to call GI

## 2017-10-23 ENCOUNTER — Other Ambulatory Visit: Payer: Self-pay | Admitting: Family Medicine

## 2017-10-23 MED ORDER — DROSPIRENONE-ETHINYL ESTRADIOL 3-0.02 MG PO TABS: 1.0000 | ORAL_TABLET | Freq: Every day | ORAL | 11 refills | Status: DC

## 2017-10-23 NOTE — Progress Notes (Signed)
Pt called indicating she was having heavy and difficult periods that were flaring her UC.  She is asking to resume the Yaz that she was previously on to help her sxs.  Prescription sent.

## 2018-03-24 MED FILL — LIALDA 1.2 GM TABLET SA: 1.2 | 30 days supply | Qty: 120 | Fill #0

## 2018-05-23 DIAGNOSIS — K5150 Left sided colitis without complications: Secondary | ICD-10-CM | POA: Diagnosis not present

## 2018-05-23 MED FILL — LIALDA 1.2 GM TABLET SA: 1.2 | 30 days supply | Qty: 120 | Fill #0

## 2018-05-26 DIAGNOSIS — D649 Anemia, unspecified: Secondary | ICD-10-CM | POA: Diagnosis not present

## 2018-05-27 MED FILL — FOLIVANE-PLUS CAPSULE: 30 days supply | Qty: 30 | Fill #0

## 2018-09-09 ENCOUNTER — Encounter: Payer: Self-pay | Admitting: Family Medicine

## 2018-09-09 NOTE — Progress Notes (Signed)
Dr. Frederico Hamman T. Lilymae Swiech, MD, [facility] [place], [address] Phone: [phone] Fax: [phone]  09/10/2018  Patient: Barbara Nicholson, MRN: [id number], DOB: Dec 03, 1976, 42 y.o.  Primary Physician:  Midge Minium, MD   Chief Complaint  Patient presents with  . Concussion   Subjective:   Barbara Nicholson is a 42 y.o. very pleasant female patient who presents with the following:  DOI 09/07/2018  She has not been seen in our division since 2015 and is considered a new patient.   The patient is well known and she presents after an incident that occurred late at night this past Saturday, when she struck her head at home and lost consciousness.  Memory surrounding this time period is unclear.   Daughter came in and [place] left in the middle of the night, and at some point, next remembers being flat on the ground on the hallway bathroom. Goose-egg on the back of her head. Unknown LOC length.   Some amnesia at least the incident at all. Speech was a little weird when waking up. Took about four steps and almost passed out early Suday morning.   We reviewed together both the ACE Concussion evaluation and SCAT5 exams.   Additional history is provided by her husband, and he is here today with her in the office.  At approximately 6 or 7 AM the patient tripped and hit her head and fell back on the floor with an unknown length of time of loss of consciousness.  She was struck on the right temporal side of the skull.  She does have amnesia before the incident.  Initially, per her husband's report, she appeared dazed, stunned, and was confused about events and what was going on.  Today, the patient reports that she still has a headache.  She also had some difficulty concentrating earlier.  She has not been sleeping well, but at baseline she does not sleep well.  The patient did try to go to work on Monday, and became progressively more  symptomatic during the workday.  Particularly, computer screens made this worse.  She did take a light walk and this did not provoke any symptoms.  She has no history of concussion, no significant migraine history.  She does have a sleep disorder.  She also has a history of having some intermittent anxiety.  On scat 5 exam, the patient notes positive symptoms of headache as well as pressure in her head as well as some difficulty concentrating.  With the total symptoms of 3 out of 22. Symptom severity score is 3 out of 132. Symptoms did get worse with minimal activity. Patient does have a slight headache, but she is otherwise feeling normal today.  She has been on full cognitive rest for approximately 36 hours.  Cognitive screening Orientation: 5/5 Immediate memory 15/15.  Concentration Digits backwards: 2/4 Months in reverse order: 1/1 Concentration score 3/5  10 cm - doubled A.  Mildly off one foot and tandem bess only, no sx  Past Medical History, Surgical History, Social History, Family History, Problem List, Medications, and Allergies have been reviewed and updated if relevant.  Patient Active Problem List   Diagnosis Date Noted  . Low vitamin B12 level 04/21/2014  . Low ferritin level 04/21/2014  . Hair thinning 04/21/2014  . Subclinical hyperthyroidism 04/20/2014  . FACIAL PARESTHESIA 11/22/2010  . COLITIS, ULCERATIVE 11/20/2010    Past Medical History:  Diagnosis Date  .  AMA (advanced maternal age) multigravida 55+   . Hx of ulcerative colitis     Past Surgical History:  Procedure Laterality Date  . CESAREAN SECTION N/A 10/13/2013   Procedure: Primary Cesarean Section Delivery Baby  Girl  @ 0122, Apgars 9/9   ;  Surgeon: Cyril Mourning, MD;  Location: [facility];  Service: Obstetrics;  Laterality: N/A;  . UTERINE SEPTUM RESECTION    . WISDOM TOOTH EXTRACTION      Social History   Socioeconomic History  . Marital status: Married    Spouse name: Not on file  .  Number of children: Not on file  . Years of education: Not on file  . Highest education level: Not on file  Occupational History  . Not on file  Social Needs  . Financial resource strain: Not on file  . Food insecurity:    Worry: Not on file    Inability: Not on file  . Transportation needs:    Medical: Not on file    Non-medical: Not on file  Tobacco Use  . Smoking status: Never Smoker  . Smokeless tobacco: Never Used  Substance and Sexual Activity  . Alcohol use: Yes    Comment: Socially   . Drug use: No  . Sexual activity: Not Currently    Comment: 2 weeks ago  Lifestyle  . Physical activity:    Days per week: Not on file    Minutes per session: Not on file  . Stress: Not on file  Relationships  . Social connections:    Talks on phone: Not on file    Gets together: Not on file    Attends religious service: Not on file    Active member of club or organization: Not on file    Attends meetings of clubs or organizations: Not on file    Relationship status: Not on file  . Intimate partner violence:    Fear of current or ex partner: Not on file    Emotionally abused: Not on file    Physically abused: Not on file    Forced sexual activity: Not on file  Other Topics Concern  . Not on file  Social History Narrative  . Not on file    Family History  Problem Relation Age of Onset  . Mesothelioma Mother     No Known Allergies  Medication list reviewed and updated in full in [facility].   GEN: No acute illnesses, no fevers, chills. GI: No n/v/d, eating normally Pulm: No SOB Interactive and getting along well at home. Neuro as above Otherwise, the pertinent positives and negatives are listed above and in the HPI, otherwise a full review of systems has been reviewed and is negative unless noted positive.   Objective:   BP 100/66   Pulse 69   Temp (!) 97.4 F (36.3 C) (Oral)   Ht 5' 4.25" (1.632 m)   Wt 109 lb 12 oz (49.8 kg)   LMP 08/14/2018   BMI  18.69 kg/m   GEN: WDWN, NAD, Non-toxic, A & O x 3 HEENT: Atraumatic, Normocephalic. Neck supple. No masses, No LAD. Ears and Nose: No external deformity. CV: RRR, No M/G/R. No JVD. No thrill. No extra heart sounds. PULM: CTA B, no wheezes, crackles, rhonchi. No retractions. No resp. distress. No accessory muscle use. EXTR: No c/c/e NEURO Normal gait.  PSYCH: Normally interactive. Conversant. Not depressed or anxious appearing.  Calm demeanor.   Neuro: CN 2-12 grossly intact. PERRLA. EOMI.  Sensation intact throughout. Str 5/5 all extremities. DTR 2+. No clonus. A and o x 4. Romberg neg. Finger nose neg. Heel -shin neg.   Heel toe walking mildly unsteady Standing on 1 foot mildly unsteady Tandem standing mildly unsteady   Oculomotor function: Patient has double vision at 10 cm with looking at the letter A on a tuning fork  Laboratory and Imaging Data:  Assessment and Plan:   Concussion with brief LOC  >45 minutes spent in face to face time with patient, >50% spent in counselling or coordination of care:  Closed head injury in a physician with loss of consciousness and amnesia.  No prior concussions.  Risk factors include sleep disorder and anxiety.  Absolute physical and neurocognitive rest.  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  She should be allowed to rest when needed.  Basic oculomotor testing today was abnormal. Thankfully, she is only mildly symptomatic currently.  Went over mild traumatic brain injury with the patient and her husband in great detail.  We talked about potential risks including increased activity mentally or physically placing increased risk for prolonged symptoms and postconcussive syndrome.  No physical exertion until cleared. Minimal driving.  I think that she is stable enough to drive a few minutes from her home in her neighborhood, but  otherwise do not drive.  Follow-up: Return in about 1 week (around 09/17/2018).    Patient Instructions  HEAD INJURY / CONCUSSSION:   MOST PEOPLE RECOVER FINE AND COMPLETELY FROM A CONCUSSION, BUT THE MOST IMPORTANT THING IS VERY EARLY COMPLETE REST SO THAT THE BRAN CAN RECOVER.  COMPLETE PHYSICAL AND MENTAL REST IS NEEDED.  THAT MEANS: NO SCHOOL OR WORK UNTIL YOU ARE BETTER NO PHYSICAL EXERTION AT ALL UNTIL YOU HAVE NO SYMPTOMS NO MENTAL EXERTION, MEANING NO WORK, NO HOMEWORK, NO TEST TAKING.  NO DRIVING UNTIL YOU ARE ASYMPTOMATIC.  NO VIDEO GAMES, NO USING THE COMPUTER, NO TEXTING, NO USING SMARTPHONES, NO USE OF AN IPAD OR TABLET. DO NOT GO TO A MOVIE THEATRE OR WATCH SPORTS ON TV. HDTV TENDS TO MAKE PEOPLE FEEL WORSE.   IN OTHER WORDS, DO NOT DO ANYTHING. SIT AND CALMLY REST UNTIL YOU FEEL BETTER. SLEEP IS OK. YOU CAN HANG OUT AND TALK TO A FRIEND.  IT IS DIFFICULT TO KNOW HOW QUICKLY YOU WILL RECOVER. SOME PEOPLE FEEL BETTER IN A FEW DAYS, WHILE OTHER PEOPLE HAVE SYMPTOMS THAT CAN LAST FOR WEEKS TO MONTHS.  EARLY REST IS BY FAR THE MOST IMPORTANT THING.  If any of the following occur notify your physician or go to the Hospital Emergency Department - if markedly worsening:  . Increased drowsiness, stupor or loss of consciousness . Restlessness or convulsions (fits) . Paralysis in arms or legs . Temperature above 100 F . Vomiting . Severe headache . Blood or clear fluid dripping from the nose or ears . Stiffness of the neck . Dizziness or blurred vision . Pulsating pain in the eye . Unequal pupils of eye . Personality changes . Any other unusual symptoms  PRECAUTIONS . Keep head elevated at all times for the first 24 hours (Elevate mattress if pillow is ineffective) . Do not take tranquilizers, sedatives, narcotics or alcohol . Avoid aspirin. Use only acetaminophen (e.g. Tylenol) or ibuprofen (e.g. Advil) for relief of pain. Follow directions on the bottle for  dosage. Marland Kitchen Use  ice packs for comfort  MEDICATIONS Use medications only as directed by your physician  Concussion Direct trauma to the head often causes a condition known as a concussion. This injury will interfere with brain function and may cause you to lose consciousness. The consequences of a concussion are usually temporary, but repetitive concussions can be very dangerous. If you have multiple concussions, you will have a greater risk of long-term effects, such as slurred speech, slow movements, impaired thinking, or tremors. The severity of a concussion is based on the length and severity of the interference with brain activity.  SYMPTOMS  Symptoms of a concussion vary depending on the severity of the injury. Very mild concussions may even occur without any noticeable symptoms. Swelling in the area of the injury is not related to the seriousness of the injury.   CAUSES  A concussion is the result of trauma to the head. When the head is subjected to such an injury, the brain strikes against the inner wall of the skull. This impact is what causes the damage to the brain. The force of injury is related to severity of injury. The most severe concussions are associated with incidents that involve large impact forces such as motor vehicle accidents. Wearing a helmet will reduce the severity of trauma to the head, but concussions may still occur if you are wearing a helmet.  RISK INCREASES WITH:  Contact sports (football, hockey, rugby, or lacrosse).  Fighting sports (martial arts or boxing).  Riding bicycles, motorcycles, or horses (when you ride without a helmet).   PREVENTION  Wear proper protective headgear and ensure correct fit.  Wear seat belts when driving and riding in a car.  Do not drink or use mind-altering drugs and drive.   PROGNOSIS  Concussions are typically curable if they are recognized and treated early. If a severe concussion or multiple concussions go untreated,  then the complications may be life-threatening or cause permanent disability and brain damage.  RELATED COMPLICATIONS   Permanent brain damage (slurred speech, slow movement, impaired thinking, or tremors).  Bleeding under the skull (subdural hemorrhage or hematoma, epidural hematoma).  Bleeding into the brain.  Prolonged healing time if usual activities are resumed too soon.  Infection if skin over the concussion site is broken.  Increased risk of future concussions (less trauma is required for a second concussion than the first).     Signed,  Maud Deed. Koya Hunger, MD   Allergies as of 09/10/2018   No Known Allergies     Medication List        Accurate as of 09/10/18 11:59 PM. Always use your most recent med list.          ferrous sulfate 325 (65 FE) MG tablet Take 325 mg by mouth daily with breakfast.

## 2018-09-10 ENCOUNTER — Ambulatory Visit: Payer: 59 | Admitting: Family Medicine

## 2018-09-10 ENCOUNTER — Encounter: Payer: Self-pay | Admitting: Family Medicine

## 2018-09-10 VITALS — BP 100/66 | HR 69 | Temp 97.4°F | Ht 64.25 in | Wt 109.8 lb

## 2018-09-10 DIAGNOSIS — S060X9A Concussion with loss of consciousness of unspecified duration, initial encounter: Secondary | ICD-10-CM | POA: Diagnosis not present

## 2018-09-10 DIAGNOSIS — S060X1A Concussion with loss of consciousness of 30 minutes or less, initial encounter: Secondary | ICD-10-CM

## 2018-09-10 HISTORY — DX: Concussion with loss of consciousness of unspecified duration, initial encounter: S06.0X9A

## 2018-09-10 NOTE — Patient Instructions (Signed)

## 2018-09-11 ENCOUNTER — Encounter: Payer: Self-pay | Admitting: Family Medicine

## 2018-09-17 ENCOUNTER — Encounter: Payer: Self-pay | Admitting: Family Medicine

## 2018-09-17 ENCOUNTER — Ambulatory Visit: Payer: 59 | Admitting: Family Medicine

## 2018-09-17 VITALS — BP 100/70 | HR 67 | Temp 97.8°F | Ht 64.25 in | Wt 110.0 lb

## 2018-09-17 DIAGNOSIS — S060X9A Concussion with loss of consciousness of unspecified duration, initial encounter: Secondary | ICD-10-CM

## 2018-09-17 DIAGNOSIS — S060X1A Concussion with loss of consciousness of 30 minutes or less, initial encounter: Secondary | ICD-10-CM

## 2018-09-17 MED ORDER — TRAZODONE HCL 50 MG PO TABS: 25.0000 mg | ORAL_TABLET | Freq: Every evening | ORAL | 3 refills | Status: DC | PRN

## 2018-09-17 MED FILL — traZODone HCL 50 MG TABS: 50 | 30 days supply | Qty: 30 | Fill #0

## 2018-09-17 NOTE — Progress Notes (Signed)
Dr. Frederico Hamman T. Jesua Tamblyn, MD, [facility] [place], [address] Phone: [phone] Fax: [phone]  09/17/2018  Patient: Barbara Nicholson, MRN: [id number], DOB: Oct 19, 1976, 42 y.o.  Primary Physician:  Midge Minium, MD   Chief Complaint  Patient presents with  . Follow-up    Concussion   Subjective:   Barbara Nicholson is a 42 y.o. very pleasant female patient who presents with the following:  DOI: 09/07/2018  F/u concussion: improved with some fatigue.   She is globally much improved and almost asymptomatic.  She does have some tiredness, and she is having some trouble sleeping, and this is really not much different from her baseline.  She baseline does have some insomnia.  She is not having any mood lability, headache, and she has been able to return to work in a restricted capacity.  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  She is been able to do this okay.  Improved SCAT5 testing compared to last exam. Memory 15/15 Concentration 4/5  Past Medical History, Surgical History, Social History, Family History, Problem List, Medications, and Allergies have been reviewed and updated if relevant.  Patient Active Problem List   Diagnosis Date Noted  . Low vitamin B12 level 04/21/2014  . Low ferritin level 04/21/2014  . Hair thinning 04/21/2014  . Subclinical hyperthyroidism 04/20/2014  . FACIAL PARESTHESIA 11/22/2010  . COLITIS, ULCERATIVE 11/20/2010    Past Medical History:  Diagnosis Date  . AMA (advanced maternal age) multigravida 23+   . Hx of ulcerative colitis     Past Surgical History:  Procedure Laterality Date  . CESAREAN SECTION N/A 10/13/2013   Procedure: Primary Cesarean Section Delivery Baby  Girl  @ 0122, Apgars 9/9   ;  Surgeon: Cyril Mourning, MD;  Location: [facility];  Service: Obstetrics;  Laterality: N/A;  . UTERINE SEPTUM RESECTION    . WISDOM TOOTH EXTRACTION       Social History   Socioeconomic History  . Marital status: Married    Spouse name: Not on file  . Number of children: Not on file  . Years of education: Not on file  . Highest education level: Not on file  Occupational History  . Not on file  Social Needs  . Financial resource strain: Not on file  . Food insecurity:    Worry: Not on file    Inability: Not on file  . Transportation needs:    Medical: Not on file    Non-medical: Not on file  Tobacco Use  . Smoking status: Never Smoker  . Smokeless tobacco: Never Used  Substance and Sexual Activity  . Alcohol use: Yes    Comment: Socially   . Drug use: No  . Sexual activity: Not Currently    Comment: 2 weeks ago  Lifestyle  . Physical activity:    Days per week: Not on file    Minutes per session: Not on file  . Stress: Not on file  Relationships  . Social connections:    Talks on phone: Not on file    Gets together: Not on file    Attends religious service: Not on file    Active member of club or organization: Not on file    Attends meetings of clubs or organizations: Not on file    Relationship status: Not on file  . Intimate partner violence:    Fear of current  or ex partner: Not on file    Emotionally abused: Not on file    Physically abused: Not on file    Forced sexual activity: Not on file  Other Topics Concern  . Not on file  Social History Narrative  . Not on file    Family History  Problem Relation Age of Onset  . Mesothelioma Mother     No Known Allergies  Medication list reviewed and updated in full in [facility].   GEN: No acute illnesses, no fevers, chills. GI: No n/v/d, eating normally Pulm: No SOB Interactive and getting along well at home.  Otherwise, ROS is as per the HPI.  Objective:   BP 100/70   Pulse 67   Temp 97.8 F (36.6 C) (Oral)   Ht 5' 4.25" (1.632 m)   Wt 110 lb (49.9 kg)   LMP 09/14/2018   BMI 18.73 kg/m   Ideal body weight: 55.3 kg (121 lb 13.7 oz)    GEN: WDWN, NAD, Non-toxic, A & O x 3 HEENT: Atraumatic, Normocephalic. Neck supple. No masses, No LAD. Ears and Nose: No external deformity. ABD: S, NT, ND, +BS. No rebound tenderness. No HSM.  EXTR: No c/c/e  Neuro: CN 2-12 grossly intact. PERRLA. EOMI. Sensation intact throughout. Str 5/5 all extremities. DTR 2+. No clonus. A and o x 4. Romberg neg. Finger nose neg. Heel -shin neg.   BESS testing steady x 3  PSYCH: Normally interactive. Conversant. Not depressed or anxious appearing.  Calm demeanor.   Laboratory and Imaging Data:  Assessment and Plan:   Concussion with brief LOC  Doing much better I will give some trazodone for sleep - use now nightly to help with recovery  Planned out of work until Monday, and at that point I think she should be able to work at full capacity.   Follow-up: prn  Meds ordered this encounter  Medications  . traZODone (DESYREL) 50 MG tablet    Sig: Take 0.5-1 tablets (25-50 mg total) by mouth at bedtime as needed for sleep.    Dispense:  30 tablet    Refill:  3   Signed,  Suliman Termini T. Jamere Stidham, MD   Allergies as of 09/17/2018   No Known Allergies     Medication List        Accurate as of 09/17/18  4:10 PM. Always use your most recent med list.          ferrous sulfate 325 (65 FE) MG tablet Take 325 mg by mouth daily with breakfast.   traZODone 50 MG tablet Commonly known as:  DESYREL Take 0.5-1 tablets (25-50 mg total) by mouth at bedtime as needed for sleep.

## 2018-10-13 MED FILL — traZODone HCL 50 MG TABS: 50 | 30 days supply | Qty: 30 | Fill #1

## 2018-11-07 DIAGNOSIS — L821 Other seborrheic keratosis: Secondary | ICD-10-CM | POA: Diagnosis not present

## 2018-11-07 DIAGNOSIS — Z419 Encounter for procedure for purposes other than remedying health state, unspecified: Secondary | ICD-10-CM | POA: Diagnosis not present

## 2018-11-07 MED FILL — traZODone HCL 50 MG TABS: 50 | 30 days supply | Qty: 30 | Fill #2

## 2018-12-09 DIAGNOSIS — Z681 Body mass index (BMI) 19 or less, adult: Secondary | ICD-10-CM | POA: Diagnosis not present

## 2018-12-09 DIAGNOSIS — Z1231 Encounter for screening mammogram for malignant neoplasm of breast: Secondary | ICD-10-CM | POA: Diagnosis not present

## 2018-12-09 DIAGNOSIS — Z01419 Encounter for gynecological examination (general) (routine) without abnormal findings: Secondary | ICD-10-CM | POA: Diagnosis not present

## 2018-12-18 ENCOUNTER — Other Ambulatory Visit: Payer: Self-pay | Admitting: Obstetrics and Gynecology

## 2018-12-18 DIAGNOSIS — Z803 Family history of malignant neoplasm of breast: Secondary | ICD-10-CM

## 2018-12-22 MED FILL — traZODone HCL 50 MG TABS: 50 | 30 days supply | Qty: 30 | Fill #3

## 2018-12-24 HISTORY — PX: COLONOSCOPY: SHX174

## 2018-12-29 MED FILL — LIALDA 1.2 GM TABLET SA: 1.2 | 30 days supply | Qty: 120 | Fill #1

## 2019-02-04 MED FILL — LIALDA 1.2 GM TABLET SA: 1.2 | 30 days supply | Qty: 120 | Fill #2

## 2019-03-06 MED FILL — LIALDA 1.2 GM TABLET SA: 1.2 | 30 days supply | Qty: 120 | Fill #3 | Status: TO

## 2019-03-06 MED FILL — predniSONE 10 MG TABS: 10 | 20 days supply | Qty: 50 | Fill #0

## 2019-04-25 DIAGNOSIS — F4321 Adjustment disorder with depressed mood: Secondary | ICD-10-CM | POA: Diagnosis not present

## 2019-04-25 DIAGNOSIS — F5105 Insomnia due to other mental disorder: Secondary | ICD-10-CM | POA: Diagnosis not present

## 2019-04-25 DIAGNOSIS — F411 Generalized anxiety disorder: Secondary | ICD-10-CM | POA: Diagnosis not present

## 2019-04-25 MED FILL — traZODone HCL 50 MG TABS: 50 | 30 days supply | Qty: 60 | Fill #0

## 2019-04-25 MED FILL — SERTRALINE HCL 25 MG TABLET: 25 | 30 days supply | Qty: 60 | Fill #0

## 2019-05-11 MED FILL — SERTRALINE HCL 50 MG TABS: 50 | 90 days supply | Qty: 90 | Fill #0

## 2019-05-11 MED FILL — LIALDA 1.2 GM TABLET SA: 1.2 | 30 days supply | Qty: 120 | Fill #0

## 2019-05-22 DIAGNOSIS — F4321 Adjustment disorder with depressed mood: Secondary | ICD-10-CM | POA: Diagnosis not present

## 2019-05-22 DIAGNOSIS — F411 Generalized anxiety disorder: Secondary | ICD-10-CM | POA: Diagnosis not present

## 2019-05-22 DIAGNOSIS — F5105 Insomnia due to other mental disorder: Secondary | ICD-10-CM | POA: Diagnosis not present

## 2019-05-22 MED FILL — traZODone HCL 100 MG TABS: 100 | 90 days supply | Qty: 90 | Fill #0

## 2019-06-12 DIAGNOSIS — F411 Generalized anxiety disorder: Secondary | ICD-10-CM | POA: Diagnosis not present

## 2019-06-12 DIAGNOSIS — F5105 Insomnia due to other mental disorder: Secondary | ICD-10-CM | POA: Diagnosis not present

## 2019-06-12 DIAGNOSIS — F4321 Adjustment disorder with depressed mood: Secondary | ICD-10-CM | POA: Diagnosis not present

## 2019-07-10 DIAGNOSIS — F5105 Insomnia due to other mental disorder: Secondary | ICD-10-CM | POA: Diagnosis not present

## 2019-07-10 DIAGNOSIS — F411 Generalized anxiety disorder: Secondary | ICD-10-CM | POA: Diagnosis not present

## 2019-07-10 DIAGNOSIS — F4321 Adjustment disorder with depressed mood: Secondary | ICD-10-CM | POA: Diagnosis not present

## 2019-08-06 MED FILL — traZODone HCL 100 MG TABS: 100 | 90 days supply | Qty: 90 | Fill #0

## 2019-08-06 MED FILL — SERTRALINE HCL 50 MG TABLET: 50 | 90 days supply | Qty: 90 | Fill #0

## 2019-08-07 DIAGNOSIS — F411 Generalized anxiety disorder: Secondary | ICD-10-CM | POA: Diagnosis not present

## 2019-08-07 DIAGNOSIS — F5105 Insomnia due to other mental disorder: Secondary | ICD-10-CM | POA: Diagnosis not present

## 2019-08-07 DIAGNOSIS — F4321 Adjustment disorder with depressed mood: Secondary | ICD-10-CM | POA: Diagnosis not present

## 2019-09-04 DIAGNOSIS — F4321 Adjustment disorder with depressed mood: Secondary | ICD-10-CM | POA: Diagnosis not present

## 2019-09-04 DIAGNOSIS — F411 Generalized anxiety disorder: Secondary | ICD-10-CM | POA: Diagnosis not present

## 2019-09-04 DIAGNOSIS — F5105 Insomnia due to other mental disorder: Secondary | ICD-10-CM | POA: Diagnosis not present

## 2019-10-06 DIAGNOSIS — Z1159 Encounter for screening for other viral diseases: Secondary | ICD-10-CM | POA: Diagnosis not present

## 2019-10-09 ENCOUNTER — Encounter: Payer: Self-pay | Admitting: Family Medicine

## 2019-10-09 DIAGNOSIS — F411 Generalized anxiety disorder: Secondary | ICD-10-CM | POA: Diagnosis not present

## 2019-10-09 DIAGNOSIS — F4321 Adjustment disorder with depressed mood: Secondary | ICD-10-CM | POA: Diagnosis not present

## 2019-10-09 DIAGNOSIS — K5150 Left sided colitis without complications: Secondary | ICD-10-CM | POA: Diagnosis not present

## 2019-10-09 DIAGNOSIS — F5105 Insomnia due to other mental disorder: Secondary | ICD-10-CM | POA: Diagnosis not present

## 2019-10-13 LAB — HM COLONOSCOPY

## 2019-10-15 MED FILL — predniSONE 10 MG TABS: 10 | 20 days supply | Qty: 50 | Fill #0

## 2019-10-15 MED FILL — LIALDA 1.2 GM TABLET SA: 1.2 | 90 days supply | Qty: 360 | Fill #0

## 2019-10-26 MED FILL — traZODone HCL 100 MG TABS: 100 | 90 days supply | Qty: 90 | Fill #0

## 2019-11-13 DIAGNOSIS — F5105 Insomnia due to other mental disorder: Secondary | ICD-10-CM | POA: Diagnosis not present

## 2019-11-13 DIAGNOSIS — F4321 Adjustment disorder with depressed mood: Secondary | ICD-10-CM | POA: Diagnosis not present

## 2019-11-13 DIAGNOSIS — F411 Generalized anxiety disorder: Secondary | ICD-10-CM | POA: Diagnosis not present

## 2019-11-17 ENCOUNTER — Encounter: Payer: Self-pay | Admitting: General Practice

## 2019-12-25 DIAGNOSIS — F411 Generalized anxiety disorder: Secondary | ICD-10-CM

## 2019-12-25 HISTORY — DX: Generalized anxiety disorder: F41.1

## 2020-01-01 DIAGNOSIS — F5105 Insomnia due to other mental disorder: Secondary | ICD-10-CM | POA: Diagnosis not present

## 2020-01-01 DIAGNOSIS — F411 Generalized anxiety disorder: Secondary | ICD-10-CM | POA: Diagnosis not present

## 2020-01-01 DIAGNOSIS — F4321 Adjustment disorder with depressed mood: Secondary | ICD-10-CM | POA: Diagnosis not present

## 2020-02-01 MED FILL — traZODone HCL 100 MG TABS: 100 | 90 days supply | Qty: 90 | Fill #1

## 2020-04-09 MED FILL — MESALAMINE DR 1.2 GM TABLET: 1.2 | 30 days supply | Qty: 120 | Fill #0

## 2020-04-26 MED FILL — traZODone HCL 100 MG TABS: 100 | 30 days supply | Qty: 30 | Fill #0

## 2020-06-13 MED FILL — MESALAMINE 1.2 GM TBEC: 1.2 | 30 days supply | Qty: 120 | Fill #1

## 2021-12-24 DIAGNOSIS — C801 Malignant (primary) neoplasm, unspecified: Secondary | ICD-10-CM

## 2021-12-24 HISTORY — DX: Malignant (primary) neoplasm, unspecified: C80.1

## 2022-04-05 ENCOUNTER — Encounter (HOSPITAL_BASED_OUTPATIENT_CLINIC_OR_DEPARTMENT_OTHER): Payer: Self-pay | Admitting: Obstetrics and Gynecology

## 2022-04-06 ENCOUNTER — Other Ambulatory Visit: Payer: Self-pay

## 2022-04-06 ENCOUNTER — Encounter (HOSPITAL_BASED_OUTPATIENT_CLINIC_OR_DEPARTMENT_OTHER): Payer: Self-pay | Admitting: Obstetrics and Gynecology

## 2022-04-06 NOTE — Progress Notes (Signed)
I spoke with Barbara Nicholson at Dr. Rickey Barbara office. Patient is cancelling surgery at [facility] for 04/09/2022. ?

## 2022-04-06 NOTE — Progress Notes (Signed)
? ? Your procedure is scheduled on Tuesday, April 10, 2022. ? Report to [facility] ? Call this number if you have problems the morning of surgery  :[phone]. ? ? [place].  WE ARE LOCATED IN THE [facility]. ? ?PLEASE BRING YOUR INSURANCE CARD AND PHOTO ID DAY OF SURGERY. ? ?ONLY 2 PEOPLE ARE ALLOWED IN  WAITING  ROOM.  ?                                   ? REMEMBER: ? DO NOT EAT FOOD, CANDY GUM OR MINTS  AFTER MIDNIGHT THE NIGHT BEFORE YOUR SURGERY . YOU MAY HAVE CLEAR LIQUIDS FROM MIDNIGHT THE NIGHT BEFORE YOUR SURGERY UNTIL  4:30 am . NO CLEAR LIQUIDS AFTER   4:30 am DAY OF SURGERY. ? ?YOU MAY  BRUSH YOUR TEETH MORNING OF SURGERY AND RINSE YOUR MOUTH OUT, NO CHEWING GUM CANDY OR MINTS. ? ? ? ? ?CLEAR LIQUID DIET ? ? ?Foods Allowed                                                                     Foods Excluded ? ?Coffee and tea, regular and decaf                             liquids that you cannot  ?Plain Jell-O                                                                   see through such as: ?Fruit ices (not with fruit pulp)                                     milk, soups, orange juice  ?Plain  Popsicles                                    All solid food ?Carbonated beverages, regular and diet                                    ?Cranberry, grape and apple juices ?Sports drinks like Gatorade ? ?Sample Menu ?Breakfast                                Lunch                                     Supper ?Cranberry juice                                           ?  Jell-O                                     Grape juice                           Apple juice ?Coffee or tea                        Jell-O                                      Popsicle ?                                               Coffee or tea                        Coffee or tea ? ?_____________________________________________________________________ ?  ? ? TAKE THESE  MEDICATIONS MORNING OF SURGERY: NONE ? ? ? UP TO 4 VISITORS  MAY VISIT IN THE EXTENDED RECOVERY ROOM UNTIL 800 PM ONLY.  1 VISITOR AGE 39 AND OVER MAY SPEND THE NIGHT AND MUST BE IN EXTENDED RECOVERY ROOM NO LATER THAN 800 PM . YOUR DISCHARGE TIME AFTER YOU SPEND THE NIGHT IS 900 AM THE MORNING AFTER YOUR SURGERY. ? ?YOU MAY PACK A SMALL OVERNIGHT BAG WITH TOILETRIES FOR YOUR OVERNIGHT STAY IF YOU WISH. ? ?YOUR PRESCRIPTION MEDICATIONS WILL BE PROVIDED DURING [facility]. ? ? ?                                   ?DO NOT WEAR JEWERLY, MAKE UP. ?DO NOT WEAR LOTIONS, POWDERS, PERFUMES OR NAIL POLISH ON YOUR FINGERNAILS. TOENAIL POLISH IS OK TO WEAR. ?DO NOT SHAVE FOR 48 HOURS PRIOR TO DAY OF SURGERY. ?MEN MAY SHAVE FACE AND NECK. ?CONTACTS, GLASSES, OR DENTURES MAY NOT BE WORN TO SURGERY. ? ?REMEMBER: NO SMOKING, DRUGS OR ALCOHOL FOR 24 HOURS BEFORE YOUR SURGERY. ?                                   ?[facility] IS NOT RESPONSIBLE  FOR ANY BELONGINGS.                                  ?                                  . ?          [facility] - Preparing for Surgery ?Before surgery, you can play an important role.  Because skin is not sterile, your skin needs to be as free of germs as possible.  You can reduce the number of germs on your skin by washing with CHG (chlorahexidine gluconate) soap before surgery.  CHG is an antiseptic cleaner which kills germs and bonds with the skin to continue killing  germs even after washing. ?Please DO NOT use if you have an allergy to CHG or antibacterial soaps.  If your skin becomes reddened/irritated stop using the CHG and inform your nurse when you arrive at Short Stay. ?Do not shave (including legs and underarms) for at least 48 hours prior to the first CHG shower.  You may shave your face/neck. ?Please follow these instructions carefully: ? 1.  Shower with CHG Soap the night before surgery and the  morning of Surgery. ? 2.  If you choose to wash your hair, wash your hair  first as usual with your  normal  shampoo. ? 3.  After you shampoo, rinse your hair and body thoroughly to remove the  shampoo.                            ?4.  Use CHG as you would any other liquid soap.  You can apply chg directly  to the skin and wash  ?                    Gently with a scrungie or clean washcloth. ? 5.  Apply the CHG Soap to your body ONLY FROM THE NECK DOWN.   Do not use on face/ open      ?                     Wound or open sores. Avoid contact with eyes, ears mouth and genitals (private parts).  ?                     Production manager,  Genitals (private parts) with your normal soap. ?            6.  Wash thoroughly, paying special attention to the area where your surgery  will be performed. ? 7.  Thoroughly rinse your body with warm water from the neck down. ? 8.  DO NOT shower/wash with your normal soap after using and rinsing off  the CHG Soap. ?               9.  Pat yourself dry with a clean towel. ?           10.  Wear clean pajamas. ?           11.  Place clean sheets on your bed the night of your first shower and do not  sleep with pets. ?Day of Surgery : ?Do not apply any lotions/deodorants the morning of surgery.  Please wear clean clothes to the hospital/surgery center. ? ?IF YOU HAVE ANY SKIN IRRITATION OR PROBLEMS WITH THE SURGICAL SOAP, PLEASE GET A BAR OF GOLD DIAL SOAP AND SHOWER THE NIGHT BEFORE YOUR SURGERY AND THE MORNING OF YOUR SURGERY. PLEASE LET THE NURSE KNOW MORNING OF YOUR SURGERY IF YOU HAD ANY PROBLEMS WITH THE SURGICAL SOAP. ? ? ?________________________________________________________________________                  ?                                    ?  QUESTIONS CALL Donelle Baba PRE OP NURSE PHONE [phone].                                    ?

## 2022-04-06 NOTE — Progress Notes (Signed)
Spoke w/ via phone for pre-op interview---Dr. Arnette Norris ?Lab needs dos----urine pregnancy POCT             ?Lab results------04/09/22 lab appt for CBC, type & screen ?COVID test -----patient states asymptomatic no test needed ?Arrive at -------0530 on  Tuesday, April 18th. ?NPO after MN NO Solid Food.  Clear liquids from MN until---0430 ?Med rec completed ?Medications to take morning of surgery -----none ?Diabetic medication -----n/a ?Patient instructed no nail polish to be worn day of surgery ?Patient instructed to bring photo id and insurance card day of surgery ?Patient aware to have Driver (ride ) / caregiver    for 24 hours after surgery - husband, Pam ?Patient Special Instructions -----none ?Pre-Op special Istructions -----none ?Patient verbalized understanding of instructions that were given at this phone interview. ?Patient denies shortness of breath, chest pain, fever, cough at this phone interview.  ?

## 2022-04-06 NOTE — H&P (Signed)
?  46 year old female here for TAH/BS for symptomatic fibroids. ?Reports pelvic pain ? ?Past Medical History:  ?Diagnosis Date  ? AMA (advanced maternal age) multigravida 53+   ? B12 deficiency 2015  ? Concussion with < 1 hr loss of consciousness 09/10/2018  ? Patient fell at home and hit her head. HA, confusion, amnesia around the time of the accident.  ? Generalized anxiety disorder 2021  ? Hx of ulcerative colitis   ? Syncope 03/14/2014  ? syncope after epidsode of explosive diarrhea  ? Ulcerative colitis ([place])   ? hx of left sided colitis 05/23/18  ? ?Past Surgical History:  ?Procedure Laterality Date  ? CESAREAN SECTION N/A 10/13/2013  ? Procedure: Primary Cesarean Section Delivery Baby  Girl  @ 0122, Apgars 9/9   ;  Surgeon: Cyril Mourning, MD;  Location: [facility];  Service: Obstetrics;  Laterality: N/A;  ? COLONOSCOPY  2020  ? UTERINE SEPTUM RESECTION    ? WISDOM TOOTH EXTRACTION    ? ?Patient has no known allergies. ? ?Prior to Admission medications   ?Medication Sig Start Date End Date Taking? Authorizing Provider  ?ferrous sulfate 325 (65 FE) MG tablet Take 325 mg by mouth daily with breakfast.    [provider]  ?traZODone (DESYREL) 50 MG tablet Take 0.5-1 tablets (25-50 mg total) by mouth at bedtime as needed for sleep. 09/17/18   Owens Loffler, MD  ? ?Family History  ?Problem Relation Age of Onset  ? Mesothelioma Mother   ? ?Social History  ? ?Socioeconomic History  ? Marital status: Married  ?  Spouse name: Not on file  ? Number of children: Not on file  ? Years of education: Not on file  ? Highest education level: Not on file  ?Occupational History  ? Not on file  ?Tobacco Use  ? Smoking status: Never  ? Smokeless tobacco: Never  ?Substance and Sexual Activity  ? Alcohol use: Yes  ?  Comment: Socially   ? Drug use: No  ? Sexual activity: Not Currently  ?  Comment: 2 weeks ago  ?Other Topics Concern  ? Not on file  ?Social History Narrative  ? Not on file  ? ?Social Determinants of Health   ? ?Financial Resource Strain: Not on file  ?Food Insecurity: Not on file  ?Transportation Needs: Not on file  ?Physical Activity: Not on file  ?Stress: Not on file  ?Social Connections: Not on file  ? ?VSS ?General alert and oriented ?Lung CTAB ?Car RRR ?Abdomen  ?Large fibroid uterus ? ?IMPRESSION: ?Symptomatic Fibroid ? ?Plan: ? ?TAH and BS ?Risks reviewed ?Consent is signed ? ? ?

## 2022-04-09 ENCOUNTER — Encounter (HOSPITAL_COMMUNITY)
Admission: RE | Admit: 2022-04-09 | Discharge: 2022-04-09 | Disposition: A | Discharge status: Home / Self Care | Payer: No Typology Code available for payment source | Source: Ambulatory Visit | Attending: Obstetrics and Gynecology | Admitting: Obstetrics and Gynecology

## 2022-04-09 ENCOUNTER — Ambulatory Visit (HOSPITAL_BASED_OUTPATIENT_CLINIC_OR_DEPARTMENT_OTHER): Admit: 2022-04-09 | Payer: 59 | Admitting: Obstetrics and Gynecology

## 2022-04-09 ENCOUNTER — Encounter (HOSPITAL_BASED_OUTPATIENT_CLINIC_OR_DEPARTMENT_OTHER): Payer: Self-pay

## 2022-04-09 DIAGNOSIS — Z01812 Encounter for preprocedural laboratory examination: Secondary | ICD-10-CM | POA: Diagnosis present

## 2022-04-09 DIAGNOSIS — Z01818 Encounter for other preprocedural examination: Secondary | ICD-10-CM

## 2022-04-09 HISTORY — DX: Ulcerative colitis, unspecified, without complications: K51.90

## 2022-04-09 LAB — CBC
HCT: 37.5 % (ref 36.0–46.0)
Hemoglobin: 12.8 g/dL (ref 12.0–15.0)
MCH: 30.7 pg (ref 26.0–34.0)
MCHC: 34.1 g/dL (ref 30.0–36.0)
MCV: 89.9 fL (ref 80.0–100.0)
Platelets: 289 10*3/uL (ref 150–400)
RBC: 4.17 MIL/uL (ref 3.87–5.11)
RDW: 13.6 % (ref 11.5–15.5)
WBC: 5.6 10*3/uL (ref 4.0–10.5)
nRBC: 0 % (ref 0.0–0.2)

## 2022-04-09 LAB — TYPE AND SCREEN
ABO/RH(D): A POS
Antibody Screen: NEGATIVE

## 2022-04-09 SURGERY — HYSTERECTOMY, TOTAL, ABDOMINAL, WITH SALPINGECTOMY
Anesthesia: Choice

## 2022-04-09 NOTE — Anesthesia Preprocedure Evaluation (Addendum)
Anesthesia Evaluation  ?Patient identified by MRN, date of birth, ID band ?Patient awake ? ? ? ?Reviewed: ?Allergy & Precautions, NPO status , Patient's Chart, lab work & pertinent test results ? ?Airway ?Mallampati: II ? ?TM Distance: >3 FB ?Neck ROM: Full ? ? ? Dental ?no notable dental hx. ? ?  ?Pulmonary ?neg pulmonary ROS,  ?  ?Pulmonary exam normal ? ? ? ? ? ? ? Cardiovascular ?negative cardio ROS ? ? ?Rhythm:Regular Rate:Normal ? ? ?  ?Neuro/Psych ?Anxiety negative neurological ROS ?   ? GI/Hepatic ?Neg liver ROS, PUD, UC ?  ?Endo/Other  ?Hyperthyroidism  ? Renal/GU ?negative Renal ROS  ?Female GU complaint ?fibroids ? ?  ?Musculoskeletal ?negative musculoskeletal ROS ?(+)  ? Abdominal ?Normal abdominal exam  (+)   ?Peds ? Hematology ? ?(+) Blood dyscrasia, anemia ,   ?Anesthesia Other Findings ? ? Reproductive/Obstetrics ? ?  ? ? ? ? ? ? ? ? ? ? ? ? ? ?  ?  ? ? ? ? ? ? ? ?Anesthesia Physical ?Anesthesia Plan ? ?ASA: 2 ? ?Anesthesia Plan: General  ? ?Post-op Pain Management: Tylenol PO (pre-op)* and Celebrex PO (pre-op)*  ? ?Induction: Intravenous ? ?PONV Risk Score and Plan: 3 and Ondansetron, Dexamethasone, Midazolam, Scopolamine patch - Pre-op and Treatment may vary due to age or medical condition ? ?Airway Management Planned: Mask and Oral ETT ? ?Additional Equipment: None ? ?Intra-op Plan:  ? ?Post-operative Plan: Extubation in OR ? ?Informed Consent: I have reviewed the patients History and Physical, chart, labs and discussed the procedure including the risks, benefits and alternatives for the proposed anesthesia with the patient or authorized representative who has indicated his/her understanding and acceptance.  ? ? ? ?Dental advisory given ? ?Plan Discussed with: CRNA ? ?Anesthesia Plan Comments: (Lab Results ?     Component                Value               Date                 ?     WBC                      5.6                 04/09/2022           ?     HGB                       12.8                04/09/2022           ?     HCT                      37.5                04/09/2022           ?     MCV                      89.9                04/09/2022           ?     PLT  289                 04/09/2022          )  ? ? ? ? ? ?Anesthesia Quick Evaluation ? ?

## 2022-04-10 ENCOUNTER — Other Ambulatory Visit: Payer: Self-pay

## 2022-04-10 ENCOUNTER — Encounter (HOSPITAL_BASED_OUTPATIENT_CLINIC_OR_DEPARTMENT_OTHER)
Admission: RE | Disposition: A | Discharge status: Home / Self Care | Payer: Self-pay | Source: Home / Self Care | Attending: Obstetrics and Gynecology

## 2022-04-10 ENCOUNTER — Inpatient Hospital Stay (HOSPITAL_BASED_OUTPATIENT_CLINIC_OR_DEPARTMENT_OTHER): Payer: No Typology Code available for payment source | Admitting: Anesthesiology

## 2022-04-10 ENCOUNTER — Ambulatory Visit (HOSPITAL_BASED_OUTPATIENT_CLINIC_OR_DEPARTMENT_OTHER)
Admission: RE | Admit: 2022-04-10 | Discharge: 2022-04-11 | Disposition: A | Discharge status: Home / Self Care | Payer: No Typology Code available for payment source | Attending: Obstetrics and Gynecology | Admitting: Obstetrics and Gynecology

## 2022-04-10 ENCOUNTER — Encounter (HOSPITAL_BASED_OUTPATIENT_CLINIC_OR_DEPARTMENT_OTHER): Payer: Self-pay | Admitting: Obstetrics and Gynecology

## 2022-04-10 DIAGNOSIS — N803 Endometriosis of pelvic peritoneum, unspecified: Secondary | ICD-10-CM

## 2022-04-10 DIAGNOSIS — D219 Benign neoplasm of connective and other soft tissue, unspecified: Secondary | ICD-10-CM | POA: Diagnosis present

## 2022-04-10 DIAGNOSIS — D759 Disease of blood and blood-forming organs, unspecified: Secondary | ICD-10-CM | POA: Diagnosis not present

## 2022-04-10 DIAGNOSIS — D259 Leiomyoma of uterus, unspecified: Secondary | ICD-10-CM

## 2022-04-10 DIAGNOSIS — F419 Anxiety disorder, unspecified: Secondary | ICD-10-CM | POA: Insufficient documentation

## 2022-04-10 DIAGNOSIS — D251 Intramural leiomyoma of uterus: Secondary | ICD-10-CM | POA: Diagnosis not present

## 2022-04-10 DIAGNOSIS — D269 Other benign neoplasm of uterus, unspecified: Secondary | ICD-10-CM | POA: Insufficient documentation

## 2022-04-10 DIAGNOSIS — R102 Pelvic and perineal pain: Secondary | ICD-10-CM | POA: Diagnosis not present

## 2022-04-10 DIAGNOSIS — N736 Female pelvic peritoneal adhesions (postinfective): Secondary | ICD-10-CM | POA: Diagnosis not present

## 2022-04-10 DIAGNOSIS — Z8711 Personal history of peptic ulcer disease: Secondary | ICD-10-CM | POA: Insufficient documentation

## 2022-04-10 DIAGNOSIS — D250 Submucous leiomyoma of uterus: Secondary | ICD-10-CM | POA: Insufficient documentation

## 2022-04-10 DIAGNOSIS — N80113 Superficial endometriosis of bilateral ovaries: Secondary | ICD-10-CM | POA: Insufficient documentation

## 2022-04-10 DIAGNOSIS — D649 Anemia, unspecified: Secondary | ICD-10-CM | POA: Insufficient documentation

## 2022-04-10 HISTORY — PX: HYSTERECTOMY ABDOMINAL WITH SALPINGECTOMY: SHX6725

## 2022-04-10 HISTORY — DX: Presence of spectacles and contact lenses: Z97.3

## 2022-04-10 HISTORY — DX: Cardiac murmur, unspecified: R01.1

## 2022-04-10 HISTORY — DX: Anemia, unspecified: D64.9

## 2022-04-10 LAB — ABO/RH: ABO/RH(D): A POS

## 2022-04-10 LAB — POCT PREGNANCY, URINE: Preg Test, Ur: NEGATIVE

## 2022-04-10 SURGERY — HYSTERECTOMY, TOTAL, ABDOMINAL, WITH SALPINGECTOMY
Anesthesia: General

## 2022-04-10 MED ORDER — ROCURONIUM BROMIDE 10 MG/ML (PF) SYRINGE
PREFILLED_SYRINGE | INTRAVENOUS | Status: DC | PRN
  Administered 2022-04-10: 5 mg via INTRAVENOUS
  Administered 2022-04-10: 50 mg via INTRAVENOUS

## 2022-04-10 MED ORDER — HYDROMORPHONE HCL 1 MG/ML IJ SOLN
0.5000 mg | Freq: Once | INTRAMUSCULAR | Status: AC
  Administered 2022-04-10: 0.5 mg via INTRAVENOUS

## 2022-04-10 MED ORDER — CELECOXIB 200 MG PO CAPS
ORAL_CAPSULE | ORAL | Status: AC
  Filled 2022-04-10: qty 1

## 2022-04-10 MED ORDER — LACTATED RINGERS IV SOLN: INTRAVENOUS | Status: DC

## 2022-04-10 MED ORDER — SODIUM CHLORIDE (PF) 0.9 % IJ SOLN
INTRAMUSCULAR | Status: AC
  Filled 2022-04-10: qty 10

## 2022-04-10 MED ORDER — GLYCOPYRROLATE PF 0.2 MG/ML IJ SOSY
PREFILLED_SYRINGE | INTRAMUSCULAR | Status: DC | PRN
  Administered 2022-04-10: .2 mg via INTRAVENOUS

## 2022-04-10 MED ORDER — GLYCOPYRROLATE PF 0.2 MG/ML IJ SOSY
PREFILLED_SYRINGE | INTRAMUSCULAR | Status: AC
  Filled 2022-04-10: qty 1

## 2022-04-10 MED ORDER — MENTHOL 3 MG MT LOZG: 1.0000 | LOZENGE | OROMUCOSAL | Status: DC | PRN

## 2022-04-10 MED ORDER — METHOCARBAMOL 1000 MG/10ML IJ SOLN
500.0000 mg | Freq: Once | INTRAVENOUS | Status: AC
  Administered 2022-04-10: 500 mg via INTRAVENOUS
  Filled 2022-04-10: qty 500

## 2022-04-10 MED ORDER — FENTANYL CITRATE (PF) 100 MCG/2ML IJ SOLN
INTRAMUSCULAR | Status: AC
  Filled 2022-04-10: qty 2

## 2022-04-10 MED ORDER — FENTANYL CITRATE (PF) 100 MCG/2ML IJ SOLN
INTRAMUSCULAR | Status: DC | PRN
  Administered 2022-04-10: 25 ug via INTRAVENOUS
  Administered 2022-04-10: 50 ug via INTRAVENOUS
  Administered 2022-04-10: 25 ug via INTRAVENOUS
  Administered 2022-04-10 (×2): 50 ug via INTRAVENOUS
  Administered 2022-04-10: 25 ug via INTRAVENOUS

## 2022-04-10 MED ORDER — IBUPROFEN 200 MG PO TABS
ORAL_TABLET | ORAL | Status: AC
  Filled 2022-04-10: qty 3

## 2022-04-10 MED ORDER — ARTIFICIAL TEARS OPHTHALMIC OINT
TOPICAL_OINTMENT | OPHTHALMIC | Status: AC
  Filled 2022-04-10: qty 3.5

## 2022-04-10 MED ORDER — ACETAMINOPHEN 325 MG PO TABS
650.0000 mg | ORAL_TABLET | ORAL | Status: DC | PRN
  Administered 2022-04-10 – 2022-04-11 (×3): 650 mg via ORAL

## 2022-04-10 MED ORDER — ONDANSETRON HCL 4 MG/2ML IJ SOLN
INTRAMUSCULAR | Status: DC | PRN
  Administered 2022-04-10: 4 mg via INTRAVENOUS

## 2022-04-10 MED ORDER — PROPOFOL 10 MG/ML IV BOLUS
INTRAVENOUS | Status: DC | PRN
  Administered 2022-04-10: 20 mg via INTRAVENOUS
  Administered 2022-04-10: 150 mg via INTRAVENOUS

## 2022-04-10 MED ORDER — MIDAZOLAM HCL 2 MG/2ML IJ SOLN
INTRAMUSCULAR | Status: DC | PRN
  Administered 2022-04-10: 2 mg via INTRAVENOUS

## 2022-04-10 MED ORDER — LIDOCAINE 2% (20 MG/ML) 5 ML SYRINGE
INTRAMUSCULAR | Status: DC | PRN
  Administered 2022-04-10: 40 mg via INTRAVENOUS

## 2022-04-10 MED ORDER — SCOPOLAMINE 1 MG/3DAYS TD PT72
1.0000 | MEDICATED_PATCH | TRANSDERMAL | Status: DC
  Administered 2022-04-10: 1.5 mg via TRANSDERMAL

## 2022-04-10 MED ORDER — SUGAMMADEX SODIUM 200 MG/2ML IV SOLN
INTRAVENOUS | Status: DC | PRN
  Administered 2022-04-10: 125 mg via INTRAVENOUS

## 2022-04-10 MED ORDER — IBUPROFEN 800 MG PO TABS
ORAL_TABLET | ORAL | Status: AC
  Filled 2022-04-10: qty 1

## 2022-04-10 MED ORDER — BUPIVACAINE HCL (PF) 0.25 % IJ SOLN
INTRAMUSCULAR | Status: DC | PRN
  Administered 2022-04-10: 10 mL

## 2022-04-10 MED ORDER — SODIUM CHLORIDE 0.9 % IV SOLN
INTRAVENOUS | Status: AC
  Filled 2022-04-10: qty 2

## 2022-04-10 MED ORDER — MIDAZOLAM HCL 2 MG/2ML IJ SOLN
INTRAMUSCULAR | Status: AC
  Filled 2022-04-10: qty 2

## 2022-04-10 MED ORDER — ROCURONIUM BROMIDE 10 MG/ML (PF) SYRINGE
PREFILLED_SYRINGE | INTRAVENOUS | Status: AC
  Filled 2022-04-10: qty 10

## 2022-04-10 MED ORDER — POVIDONE-IODINE 10 % EX SWAB: 2.0000 "application " | Freq: Once | CUTANEOUS | Status: DC

## 2022-04-10 MED ORDER — DEXAMETHASONE SODIUM PHOSPHATE 10 MG/ML IJ SOLN
INTRAMUSCULAR | Status: DC | PRN
Start: 2022-04-10 — End: 2022-04-10
  Administered 2022-04-10: 10 mg via INTRAVENOUS

## 2022-04-10 MED ORDER — ONDANSETRON HCL 4 MG/2ML IJ SOLN
INTRAMUSCULAR | Status: AC
  Filled 2022-04-10: qty 2

## 2022-04-10 MED ORDER — ACETAMINOPHEN 500 MG PO TABS
1000.0000 mg | ORAL_TABLET | Freq: Once | ORAL | Status: AC
  Administered 2022-04-10: 1000 mg via ORAL

## 2022-04-10 MED ORDER — HYDROMORPHONE HCL 1 MG/ML IJ SOLN
INTRAMUSCULAR | Status: AC
  Filled 2022-04-10: qty 1

## 2022-04-10 MED ORDER — SODIUM CHLORIDE 0.9 % IV SOLN
2.0000 g | INTRAVENOUS | Status: AC
  Administered 2022-04-10: 2 g via INTRAVENOUS

## 2022-04-10 MED ORDER — IBUPROFEN 200 MG PO TABS
600.0000 mg | ORAL_TABLET | Freq: Four times a day (QID) | ORAL | Status: DC
  Administered 2022-04-10 – 2022-04-11 (×4): 600 mg via ORAL

## 2022-04-10 MED ORDER — OXYCODONE HCL 5 MG PO TABS
ORAL_TABLET | ORAL | Status: AC
  Filled 2022-04-10: qty 2

## 2022-04-10 MED ORDER — SCOPOLAMINE 1 MG/3DAYS TD PT72
MEDICATED_PATCH | TRANSDERMAL | Status: AC
  Filled 2022-04-10: qty 1

## 2022-04-10 MED ORDER — ACETAMINOPHEN 325 MG PO TABS
ORAL_TABLET | ORAL | Status: AC
  Filled 2022-04-10: qty 2

## 2022-04-10 MED ORDER — FENTANYL CITRATE (PF) 100 MCG/2ML IJ SOLN
INTRAMUSCULAR | Status: AC
Start: 2022-04-10 — End: ?
  Filled 2022-04-10: qty 2

## 2022-04-10 MED ORDER — HYDROMORPHONE HCL 1 MG/ML IJ SOLN
0.2000 mg | INTRAMUSCULAR | Status: DC | PRN
  Administered 2022-04-10: 0.5 mg via INTRAVENOUS

## 2022-04-10 MED ORDER — ACETAMINOPHEN 500 MG PO TABS
ORAL_TABLET | ORAL | Status: AC
  Filled 2022-04-10: qty 2

## 2022-04-10 MED ORDER — OXYCODONE HCL 5 MG PO TABS
5.0000 mg | ORAL_TABLET | ORAL | Status: DC | PRN
  Administered 2022-04-10 (×3): 10 mg via ORAL
  Administered 2022-04-11: 5 mg via ORAL
  Administered 2022-04-11: 10 mg via ORAL

## 2022-04-10 MED ORDER — PROPOFOL 10 MG/ML IV BOLUS
INTRAVENOUS | Status: AC
Start: 2022-04-10 — End: ?
  Filled 2022-04-10: qty 20

## 2022-04-10 MED ORDER — ONDANSETRON HCL 4 MG/2ML IJ SOLN: 4.0000 mg | Freq: Four times a day (QID) | INTRAMUSCULAR | Status: DC | PRN

## 2022-04-10 MED ORDER — CELECOXIB 200 MG PO CAPS
200.0000 mg | ORAL_CAPSULE | Freq: Once | ORAL | Status: AC
  Administered 2022-04-10: 200 mg via ORAL

## 2022-04-10 MED ORDER — 0.9 % SODIUM CHLORIDE (POUR BTL) OPTIME
TOPICAL | Status: DC | PRN
  Administered 2022-04-10: 3000 mL

## 2022-04-10 MED ORDER — FENTANYL CITRATE (PF) 100 MCG/2ML IJ SOLN
25.0000 ug | INTRAMUSCULAR | Status: DC | PRN
  Administered 2022-04-10 (×2): 25 ug via INTRAVENOUS
  Administered 2022-04-10: 50 ug via INTRAVENOUS

## 2022-04-10 MED ORDER — ONDANSETRON HCL 4 MG PO TABS: 4.0000 mg | ORAL_TABLET | Freq: Four times a day (QID) | ORAL | Status: DC | PRN

## 2022-04-10 MED ORDER — DEXAMETHASONE SODIUM PHOSPHATE 10 MG/ML IJ SOLN
INTRAMUSCULAR | Status: AC
  Filled 2022-04-10: qty 1

## 2022-04-10 SURGICAL SUPPLY — 39 items
APL SKNCLS STERI-STRIP NONHPOA (GAUZE/BANDAGES/DRESSINGS) ×1
BENZOIN TINCTURE PRP APPL 2/3 (GAUZE/BANDAGES/DRESSINGS) ×2 IMPLANT
BLADE EXTENDED COATED 6.5IN (ELECTRODE) IMPLANT
BRR ADH 6X5 SEPRAFILM 1 SHT (MISCELLANEOUS)
CLSR STERI-STRIP ANTIMIC 1/2X4 (GAUZE/BANDAGES/DRESSINGS) ×2 IMPLANT
DRAPE WARM FLUID 44X44 (DRAPES) ×2 IMPLANT
DRSG OPSITE POSTOP 4X10 (GAUZE/BANDAGES/DRESSINGS) ×2 IMPLANT
DRSG PAD ABDOMINAL 8X10 ST (GAUZE/BANDAGES/DRESSINGS) ×1 IMPLANT
DURAPREP 26ML APPLICATOR (WOUND CARE) ×2 IMPLANT
GLOVE BIO SURGEON STRL SZ 6.5 (GLOVE) ×2 IMPLANT
GLOVE BIOGEL PI IND STRL 7.0 (GLOVE) ×3 IMPLANT
GLOVE BIOGEL PI INDICATOR 7.0 (GLOVE) ×3
GOWN STRL REUS W/TWL LRG LVL3 (GOWN DISPOSABLE) ×4 IMPLANT
HOLDER FOLEY CATH W/STRAP (MISCELLANEOUS) ×2 IMPLANT
KIT TURNOVER CYSTO (KITS) ×2 IMPLANT
MANIFOLD NEPTUNE II (INSTRUMENTS) ×2 IMPLANT
NEEDLE HYPO 22GX1.5 SAFETY (NEEDLE) ×2 IMPLANT
NS IRRIG 500ML POUR BTL (IV SOLUTION) ×2 IMPLANT
PACK ABDOMINAL GYN (CUSTOM PROCEDURE TRAY) ×2 IMPLANT
PAD OB MATERNITY 4.3X12.25 (PERSONAL CARE ITEMS) ×2 IMPLANT
SEPRAFILM MEMBRANE 5X6 (MISCELLANEOUS) IMPLANT
SPONGE T-LAP 18X18 ~~LOC~~+RFID (SPONGE) ×4 IMPLANT
SPONGE T-LAP 4X18 ~~LOC~~+RFID (SPONGE) IMPLANT
STRIP CLOSURE SKIN 1/2X4 (GAUZE/BANDAGES/DRESSINGS) ×1 IMPLANT
SUT PDS AB 0 CT1 36 (SUTURE) IMPLANT
SUT PDS AB 0 CTX 60 (SUTURE) IMPLANT
SUT PLAIN 2 0 XLH (SUTURE) ×2 IMPLANT
SUT VIC AB 0 CT1 18XCR BRD8 (SUTURE) ×2 IMPLANT
SUT VIC AB 0 CT1 27 (SUTURE) ×8
SUT VIC AB 0 CT1 27XBRD ANBCTR (SUTURE) ×4 IMPLANT
SUT VIC AB 0 CT1 8-18 (SUTURE) ×4
SUT VIC AB 2-0 CT1 27 (SUTURE)
SUT VIC AB 2-0 CT1 TAPERPNT 27 (SUTURE) IMPLANT
SUT VIC AB 4-0 KS 27 (SUTURE) ×2 IMPLANT
SUT VICRYL 0 TIES 12 18 (SUTURE) ×2 IMPLANT
SYR BULB IRRIG 60ML STRL (SYRINGE) IMPLANT
SYR CONTROL 10ML LL (SYRINGE) ×2 IMPLANT
TOWEL OR 17X26 10 PK STRL BLUE (TOWEL DISPOSABLE) ×4 IMPLANT
TRAY FOLEY W/BAG SLVR 14FR LF (SET/KITS/TRAYS/PACK) ×2 IMPLANT

## 2022-04-10 NOTE — Anesthesia Postprocedure Evaluation (Signed)
Anesthesia Post Note ? ?Patient: Barbara Nicholson ? ?Procedure(s) Performed: HYSTERECTOMY ABDOMINAL WITH BILATERAL SALPINGECTOMY ? ?  ? ?Patient location during evaluation: PACU ?Anesthesia Type: General ?Level of consciousness: awake and alert ?Pain management: pain level controlled ?Vital Signs Assessment: post-procedure vital signs reviewed and stable ?Respiratory status: spontaneous breathing, nonlabored ventilation, respiratory function stable and patient connected to nasal cannula oxygen ?Cardiovascular status: blood pressure returned to baseline and stable ?Postop Assessment: no apparent nausea or vomiting ?Anesthetic complications: no ? ? ?No notable events documented. ? ?Last Vitals:  ?Vitals:  ? 04/10/22 1100 04/10/22 1130  ?BP: 116/82 121/78  ?Pulse: 68 70  ?Resp: 12 12  ?Temp: (!) 36.4 ?C   ?SpO2: 98% 100%  ?  ?Last Pain:  ?Vitals:  ? 04/10/22 1100  ?TempSrc:   ?PainSc: 9   ? ? ?  ?  ?  ?  ?  ?  ? ?[name] ? ? ? ? ?

## 2022-04-10 NOTE — Progress Notes (Signed)
H and P on the chart ?No changes ?BP 101/67   Pulse 66   Temp 98.1 ?F (36.7 ?C) (Oral)   Resp 15   Ht 5' 4"  (1.626 m)   Wt 55 kg   LMP 03/16/2022 (Approximate)   SpO2 100%   BMI 20.82 kg/m?  ?Results for orders placed or performed during the hospital encounter of 04/10/22 (from the past 24 hour(s))  ?Pregnancy, urine POC     Status: None  ? Collection Time: 04/10/22  5:34 AM  ?Result Value Ref Range  ? Preg Test, Ur NEGATIVE NEGATIVE  ? ?Will proceed with TAH/BS ?Risks reviewed again ?Consent signed ?

## 2022-04-10 NOTE — Anesthesia Procedure Notes (Signed)
Procedure Name: Intubation ?Date/Time: 04/10/2022 7:42 AM ?Performed by: Mechele Claude, CRNA ?Pre-anesthesia Checklist: Patient identified, Emergency Drugs available, Suction available and Patient being monitored ?Patient Re-evaluated:Patient Re-evaluated prior to induction ?Oxygen Delivery Method: Circle system utilized ?Preoxygenation: Pre-oxygenation with 100% oxygen ?Induction Type: IV induction ?Ventilation: Mask ventilation without difficulty ?Laryngoscope Size: Mac and 3 ?Grade View: Grade I ?Tube type: Oral ?Tube size: 7.0 mm ?Number of attempts: 1 ?Airway Equipment and Method: Stylet and Oral airway ?Placement Confirmation: ETT inserted through vocal cords under direct vision, positive ETCO2 and breath sounds checked- equal and bilateral ?Secured at: 21 cm ?Tube secured with: Tape ?Dental Injury: Teeth and Oropharynx as per pre-operative assessment  ? ? ? ? ?

## 2022-04-10 NOTE — Progress Notes (Signed)
POD # 0  ?Patient still noticing back pain and vaginal pain 7/10 ?BP 109/76 (BP Location: Left Arm)   Pulse 65   Temp 97.8 ?F (36.6 ?C)   Resp 14   Ht 5' 4"  (1.626 m)   Wt 55 kg   LMP 03/16/2022 (Approximate)   SpO2 100%   BMI 20.82 kg/m?  ?Results for orders placed or performed during the hospital encounter of 04/10/22 (from the past 24 hour(s))  ?Pregnancy, urine POC     Status: None  ? Collection Time: 04/10/22  5:34 AM  ?Result Value Ref Range  ? Preg Test, Ur NEGATIVE NEGATIVE  ?ABO/Rh     Status: None  ? Collection Time: 04/10/22  6:20 AM  ?Result Value Ref Range  ? ABO/RH(D)    ?  A POS ?Performed at [facility], [place] [address] [place], [place] [address] ?  ? ?Abdomen is soft and flat  ?Dressings removed ?No active bleeding  ? ?POD # 0  ?Remove Foley - may improve pain  ?Ambulate ?Adjust pain medication as needed ?Advance diet as tolerated ?

## 2022-04-10 NOTE — Transfer of Care (Signed)
Immediate Anesthesia Transfer of Care Note ? ?Patient: Barbara Nicholson ? ?Procedure(s) Performed: Procedure(s) (LRB): ?HYSTERECTOMY ABDOMINAL WITH BILATERAL SALPINGECTOMY (N/A) ? ?Patient Location: PACU ? ?Anesthesia Type: General ? ?Level of Consciousness: awake, alert  and oriented ? ?Airway & Oxygen Therapy: Patient Spontanous Breathing  ? ?Post-op Assessment: Report given to PACU RN and Post -op Vital signs reviewed and stable ? ?Post vital signs: Reviewed and stable ? ?Complications: No apparent anesthesia complications ? ?Last Vitals:  ?Vitals Value Taken Time  ?BP 113/68 04/10/22 0915  ?Temp    ?Pulse 85 04/10/22 0918  ?Resp 13 04/10/22 0918  ?SpO2 96 % 04/10/22 0918  ?Vitals shown include unvalidated device data. ? ?Last Pain:  ?Vitals:  ? 04/10/22 0919  ?TempSrc:   ?PainSc: 8   ?   ? ?Patients Stated Pain Goal: 5 (04/10/22 0601) ? ?Complications: No notable events documented. ?

## 2022-04-10 NOTE — Brief Op Note (Signed)
04/10/2022 ? ?9:02 AM ? ?PATIENT:  Barbara Nicholson  46 y.o. female ? ?PRE-OPERATIVE DIAGNOSIS:  ?Symptomatic Fibroids ? ?POST-OPERATIVE DIAGNOSIS: ?Same  ?Pelvic Endometriosis ? ?PROCEDURE:  Procedure(s): ?HYSTERECTOMY ABDOMINAL WITH BILATERAL SALPINGECTOMY (N/A) ? ?SURGEON:  Surgeon(s) and Role: ?   Dian Queen, MD - Primary ?   Everlene Farrier, MD - Assisting ? ?PHYSICIAN ASSISTANT:  ? ?ASSISTANTS: none  ? ?ANESTHESIA:   general ? ?EBL:  90 mL  ? ?BLOOD ADMINISTERED:none ? ?DRAINS: Urinary Catheter (Foley)  ? ?LOCAL MEDICATIONS USED:  LIDOCAINE  ? ?SPECIMEN:  Source of Specimen:  uterus and cervix and fallopian tubes ? ?DISPOSITION OF SPECIMEN:  PATHOLOGY ? ?COUNTS:  YES ? ?TOURNIQUET:  * No tourniquets in log * ? ?DICTATION: .Other Dictation: Dictation Number dictated ? ?PLAN OF CARE: Admit for overnight observation ? ?PATIENT DISPOSITION:  PACU - hemodynamically stable. ?  ?Delay start of Pharmacological VTE agent (>24hrs) due to surgical blood loss or risk of bleeding: not applicable ? ?

## 2022-04-11 ENCOUNTER — Encounter (HOSPITAL_BASED_OUTPATIENT_CLINIC_OR_DEPARTMENT_OTHER): Payer: Self-pay | Admitting: Obstetrics and Gynecology

## 2022-04-11 DIAGNOSIS — R102 Pelvic and perineal pain: Secondary | ICD-10-CM | POA: Diagnosis not present

## 2022-04-11 LAB — CBC
HCT: 30.1 % — ABNORMAL LOW (ref 36.0–46.0)
Hemoglobin: 10.5 g/dL — ABNORMAL LOW (ref 12.0–15.0)
MCH: 31.1 pg (ref 26.0–34.0)
MCHC: 34.9 g/dL (ref 30.0–36.0)
MCV: 89.1 fL (ref 80.0–100.0)
Platelets: 234 10*3/uL (ref 150–400)
RBC: 3.38 MIL/uL — ABNORMAL LOW (ref 3.87–5.11)
RDW: 13.4 % (ref 11.5–15.5)
WBC: 8.9 10*3/uL (ref 4.0–10.5)
nRBC: 0 % (ref 0.0–0.2)

## 2022-04-11 MED ORDER — ACETAMINOPHEN 325 MG PO TABS
ORAL_TABLET | ORAL | Status: AC
  Filled 2022-04-11: qty 2

## 2022-04-11 MED ORDER — IBUPROFEN 200 MG PO TABS
ORAL_TABLET | ORAL | Status: AC
Start: 2022-04-11 — End: ?
  Filled 2022-04-11: qty 3

## 2022-04-11 MED ORDER — IBUPROFEN 200 MG PO TABS
ORAL_TABLET | ORAL | Status: AC
  Filled 2022-04-11: qty 3

## 2022-04-11 MED ORDER — OXYCODONE HCL 5 MG PO TABS
ORAL_TABLET | ORAL | Status: AC
  Filled 2022-04-11: qty 2

## 2022-04-11 MED ORDER — IBUPROFEN 600 MG PO TABS: 600.0000 mg | ORAL_TABLET | Freq: Four times a day (QID) | ORAL | 0 refills | Status: DC

## 2022-04-11 MED ORDER — OXYCODONE HCL 5 MG PO TABS: 5.0000 mg | ORAL_TABLET | ORAL | 0 refills | Status: DC | PRN

## 2022-04-11 MED ORDER — OXYCODONE HCL 5 MG PO TABS
ORAL_TABLET | ORAL | Status: AC
  Filled 2022-04-11: qty 1

## 2022-04-11 NOTE — Op Note (Signed)
NAME: Barbara Nicholson, Barbara Nicholson. ?MEDICAL RECORD NO: [id number] ?ACCOUNT NO: [id number] ?DATE OF BIRTH: 1975-12-30 ?FACILITY: [facility] ?LOCATION: [facility] ?PHYSICIAN: Boruch Manuele L. Helane Rima, MD ? ?Operative Report  ? ?DATE OF PROCEDURE: 04/10/2022 ? ?PREOPERATIVE DIAGNOSIS:  Symptomatic fibroids. ? ?POSTOPERATIVE DIAGNOSIS:  Symptomatic fibroids and pelvic endometriosis. ? ?SURGEON:  Mekesha Solomon L. Helane Rima, MD ? ?ASSISTANT:  Dorene Grebe. Tomlin. ? ?TYPE OF ANESTHESIA:  General. ? ?ESTIMATED BLOOD LOSS:  Less than 100 mL ? ?COMPLICATIONS:  None. ? ?SPECIMENS:  Uterus, cervix and fallopian tubes. ? ?DESCRIPTION OF PROCEDURE:  The patient was taken to the operating room after informed consent was obtained.  She was then prepped and draped in the usual sterile fashion after she was intubated.  A Foley catheter was inserted and draining clear urine.   ?After the sterile drape was applied and a timeout had been performed a low transverse incision was made with a scalpel and carried down to the fascia.  Fascia was scored in the midline and extended laterally.  The rectus muscles were separated in the  ?midline.  Peritoneum was then entered bluntly.  The peritoneal incision was then stretched.  The uterus was then exteriorized and noted to be enlarged and myomatous.  We also saw that there was some pelvic endometriosis noted that was noted behind the  ?uterus and ovaries were slightly adherent to the back wall of the uterus. We freed that up and we could see that there was some surface endometriosis on both ovaries.  The patient had decided not to have oophorectomy.  We then proceeded with the  ?hysterectomy by suture ligating the round ligament on the right side and cauterizing the round ligament, dividing the anterior and posterior leaves of the broad ligament.  We then placed curved Heaney clamps just beneath the triple pedicle.  Each pedicle ? was clamped and suture ligated using 0 Vicryl suture and further secured using a free tie of 0 Vicryl  suture.  We then developed the bladder flap.  There were some dense adhesions involving the bladder flap because of the previous cesarean sections, but ? we were able take it down carefully with sharp dissection and careful to avoid any injury to the bladder.  We then placed curved Heaney clamps to the level of the internal os on either side.  Each pedicle was clamped, cut and suture ligated using 0  ?Vicryl suture.  We then carefully walked our way down the uterosacral cardinal ligament complex right beside the cervix.  Each pedicle was clamped, cut and suture ligated using 0 Vicryl suture.  We then reached the level of the external os.  We then  ?removed the specimen in standard fashion.  The angle stitches were placed using 0 Vicryl suture and the cuff was closed completely in a running stitch using 0 Vicryl suture.  Irrigation was very good that was performed.  Hemostasis was very good.  We  ?then went back and performed a bilateral salpingectomy by placing a curved Heaney clamp across each mesosalpinx each specimen was then removed with Mayo scissors and the pedicle was secured using a suture ligature of 0 Vicryl suture.  We then inspected  ?the pelvis completely.  Hemostasis was again very good.  We then noted that there was a little bit of endometriosis that was kind of on the reflection of the rectosigmoid colon not an area that we could safely fulgurate or remove.  It was very small.  At ? the end of the procedure then, we decided to  close with 0 Vicryl suture and after the peritoneum was closed, we then closed the fascia with 0 Vicryl suture in a running stitch x2 starting in each corner and meeting in the midline.  The subcutaneous was  ?closed with plain gut suture.  The skin was closed with 3-0 Vicryl on a Keith needle.  Steri-Strips, benzoin and a honeycomb dressing were applied.  All sponge, lap and instrument counts were correct x2.  The patient went to recovery room in stable   ?condition. ? ? ?PUS ?D: 04/11/2022 10:48:32 am T: 04/11/2022 3:15:00 pm  ?JOB: [id number]/ [id number]  ?

## 2022-04-11 NOTE — Discharge Summary (Signed)
Discharge date April 11, 2022 ? ?Admission Date April 10, 2022 ? ?Preop Diagnosis: Fibroids ?Post op Diagnosis: Same ?Endometriosis ? ?Hospital Course:  ?Patient is a 46 year old female admitted for TAH/BS. At time of surgery, multiple fibroids are noted. She also was noted to have pelvic endometriosis. ? ?Patient did very well post op. Her Foley was removed on POD # 0.  By POD # 1, she was ambulating, voiding and tolerating regular diet. ? ?BP 97/70 (BP Location: Left Arm)   Pulse (!) 59   Temp 98.4 ?F (36.9 ?C)   Resp 14   Ht 5' 4"  (1.626 m)   Wt 55 kg   LMP 03/16/2022 (Approximate)   SpO2 98%   BMI 20.82 kg/m?  ?Results for orders placed or performed during the hospital encounter of 04/10/22 (from the past 24 hour(s))  ?CBC     Status: Abnormal  ? Collection Time: 04/11/22 12:24 AM  ?Result Value Ref Range  ? WBC 8.9 4.0 - 10.5 K/uL  ? RBC 3.38 (L) 3.87 - 5.11 MIL/uL  ? Hemoglobin 10.5 (L) 12.0 - 15.0 g/dL  ? HCT 30.1 (L) 36.0 - 46.0 %  ? MCV 89.1 80.0 - 100.0 fL  ? MCH 31.1 26.0 - 34.0 pg  ? MCHC 34.9 30.0 - 36.0 g/dL  ? RDW 13.4 11.5 - 15.5 %  ? Platelets 234 150 - 400 K/uL  ? nRBC 0.0 0.0 - 0.2 %  ? ?Abdomen is soft and non tender ?Incision is clean and dry and intact  ?Bandage is clean and dry ? ?Patient discharged home in good condition ?Rx ibuprofen and Oxycodone given to patient ?Follow up in 1 week ?Discharge instructions given to patient. ?

## 2022-04-12 LAB — SURGICAL PATHOLOGY

## 2022-09-23 DIAGNOSIS — N12 Tubulo-interstitial nephritis, not specified as acute or chronic: Secondary | ICD-10-CM

## 2022-09-23 HISTORY — DX: Tubulo-interstitial nephritis, not specified as acute or chronic: N12

## 2022-10-05 ENCOUNTER — Emergency Department (HOSPITAL_COMMUNITY)
Admission: EM | Admit: 2022-10-05 | Discharge: 2022-10-05 | Disposition: A | Discharge status: Home / Self Care | Payer: No Typology Code available for payment source | Attending: Emergency Medicine | Admitting: Emergency Medicine

## 2022-10-05 ENCOUNTER — Encounter (HOSPITAL_COMMUNITY): Payer: Self-pay

## 2022-10-05 ENCOUNTER — Emergency Department (HOSPITAL_COMMUNITY): Payer: No Typology Code available for payment source

## 2022-10-05 ENCOUNTER — Other Ambulatory Visit: Payer: Self-pay

## 2022-10-05 DIAGNOSIS — R109 Unspecified abdominal pain: Secondary | ICD-10-CM | POA: Diagnosis present

## 2022-10-05 DIAGNOSIS — N12 Tubulo-interstitial nephritis, not specified as acute or chronic: Secondary | ICD-10-CM | POA: Insufficient documentation

## 2022-10-05 LAB — CBC WITH DIFFERENTIAL/PLATELET
Abs Immature Granulocytes: 0.02 10*3/uL (ref 0.00–0.07)
Basophils Absolute: 0.1 10*3/uL (ref 0.0–0.1)
Basophils Relative: 1 %
Eosinophils Absolute: 0.9 10*3/uL — ABNORMAL HIGH (ref 0.0–0.5)
Eosinophils Relative: 10 %
HCT: 33 % — ABNORMAL LOW (ref 36.0–46.0)
Hemoglobin: 11 g/dL — ABNORMAL LOW (ref 12.0–15.0)
Immature Granulocytes: 0 %
Lymphocytes Relative: 16 %
Lymphs Abs: 1.4 10*3/uL (ref 0.7–4.0)
MCH: 28.1 pg (ref 26.0–34.0)
MCHC: 33.3 g/dL (ref 30.0–36.0)
MCV: 84.2 fL (ref 80.0–100.0)
Monocytes Absolute: 0.4 10*3/uL (ref 0.1–1.0)
Monocytes Relative: 5 %
Neutro Abs: 5.8 10*3/uL (ref 1.7–7.7)
Neutrophils Relative %: 68 %
Platelets: 477 10*3/uL — ABNORMAL HIGH (ref 150–400)
RBC: 3.92 MIL/uL (ref 3.87–5.11)
RDW: 12.4 % (ref 11.5–15.5)
WBC: 8.6 10*3/uL (ref 4.0–10.5)
nRBC: 0 % (ref 0.0–0.2)

## 2022-10-05 LAB — COMPREHENSIVE METABOLIC PANEL
ALT: 15 U/L (ref 0–44)
AST: 6 U/L — ABNORMAL LOW (ref 15–41)
Albumin: 3.7 g/dL (ref 3.5–5.0)
Alkaline Phosphatase: 61 U/L (ref 38–126)
Anion gap: 8 (ref 5–15)
BUN: 5 mg/dL — ABNORMAL LOW (ref 6–20)
CO2: 27 mmol/L (ref 22–32)
Calcium: 9.1 mg/dL (ref 8.9–10.3)
Chloride: 100 mmol/L (ref 98–111)
Creatinine, Ser: 0.46 mg/dL (ref 0.44–1.00)
GFR, Estimated: >60 mL/min (ref >60)
Glucose, Bld: 107 mg/dL — ABNORMAL HIGH (ref 70–99)
Potassium: 3.7 mmol/L (ref 3.5–5.1)
Sodium: 135 mmol/L (ref 135–145)
Total Bilirubin: 0.5 mg/dL (ref 0.3–1.2)
Total Protein: 7 g/dL (ref 6.5–8.1)

## 2022-10-05 LAB — URINALYSIS, ROUTINE W REFLEX MICROSCOPIC
Bacteria, UA: NONE SEEN
Bilirubin Urine: NEGATIVE
Glucose, UA: NEGATIVE mg/dL
Hgb urine dipstick: NEGATIVE
Ketones, ur: NEGATIVE mg/dL
Leukocytes,Ua: NEGATIVE
Nitrite: NEGATIVE
Protein, ur: >=300 mg/dL — AB
Specific Gravity, Urine: 1.005 (ref 1.005–1.030)
pH: 7 (ref 5.0–8.0)

## 2022-10-05 LAB — LIPASE, BLOOD: Lipase: 29 U/L (ref 11–51)

## 2022-10-05 MED ORDER — SODIUM CHLORIDE 0.9 % IV SOLN
1.0000 g | Freq: Once | INTRAVENOUS | Status: AC
  Administered 2022-10-05: 1 g via INTRAVENOUS
  Filled 2022-10-05: qty 10

## 2022-10-05 MED ORDER — IOHEXOL 300 MG/ML  SOLN
100.0000 mL | Freq: Once | INTRAMUSCULAR | Status: AC | PRN
  Administered 2022-10-05: 100 mL via INTRAVENOUS

## 2022-10-05 MED ORDER — ONDANSETRON 4 MG PO TBDP: 4.0000 mg | ORAL_TABLET | Freq: Three times a day (TID) | ORAL | 0 refills | Status: DC | PRN

## 2022-10-05 MED ORDER — MORPHINE SULFATE (PF) 4 MG/ML IV SOLN
4.0000 mg | Freq: Once | INTRAVENOUS | Status: AC
  Administered 2022-10-05: 4 mg via INTRAVENOUS
  Filled 2022-10-05: qty 1

## 2022-10-05 MED ORDER — SODIUM CHLORIDE 0.9 % IV BOLUS
1000.0000 mL | Freq: Once | INTRAVENOUS | Status: AC
  Administered 2022-10-05: 1000 mL via INTRAVENOUS

## 2022-10-05 MED ORDER — HYDROCODONE-ACETAMINOPHEN 5-325 MG PO TABS: 1.0000 | ORAL_TABLET | Freq: Four times a day (QID) | ORAL | 0 refills | Status: DC | PRN

## 2022-10-05 NOTE — ED Triage Notes (Signed)
Patient c/o left flank pain and left lower abdominal pain x 5-6 days.  Patient went to an Ob/Gyn physician today and had an transvaginal US today and was told to come to the Ed to r/o a kidney stone.  Patient denies any dysuria or frequency.

## 2022-10-05 NOTE — ED Provider Notes (Signed)
[facility] DEPT Provider Note   CSN: [id number] Arrival date & time: 10/05/22  1101     History  Chief Complaint  Patient presents with   Abdominal Pain   Flank Pain    Barbara Nicholson is a 46 y.o. female with a past medical history significant for ulcerative colitis, subclinical hyperthyroidism, anxiety, and B12 deficiency who presents to the ED due to left flank and lower abdominal pain x1 week.  Patient admits to 1 episode of hematuria 1 week ago however, none since.  No history of kidney stones.  No history of diverticulitis.  Patient evaluated by OB/GYN earlier today where she had a transvaginal ultrasound performed which showed a left endometrioma.  OB/GYN did not feel the small endometrioma could be causing her pain so patient was sent to the ED for further evaluation. She is currently on ciprofloxacin for possible UTI by OBGYN. She has been on it for 2.5 days. Patient had a recent hysterectomy roughly 6 months ago.  No dysuria.  Denies vaginal discharge.  No concern for STIs.  Previous hysterectomy and cesarean section.  No other abdominal operations.  Denies chest pain and shortness of breath.  History obtained from patient and past medical records. No interpreter used during encounter.      Home Medications Prior to Admission medications   Medication Sig Start Date End Date Taking? Authorizing Provider  HYDROcodone-acetaminophen (NORCO/VICODIN) 5-325 MG tablet Take 1 tablet by mouth every 6 (six) hours as needed for severe pain. 10/05/22  Yes Dinorah Masullo C, PA-C  ondansetron (ZOFRAN-ODT) 4 MG disintegrating tablet Take 1 tablet (4 mg total) by mouth every 8 (eight) hours as needed for nausea or vomiting. 10/05/22  Yes Ellanore Vanhook C, PA-C  ibuprofen (ADVIL) 600 MG tablet Take 1 tablet (600 mg total) by mouth every 6 (six) hours. 04/11/22   Dian Queen, MD  Omega-3 Fatty Acids (OMEGA-3 CF PO) Take by mouth. With D3 for  dry eyes     [provider]  oxyCODONE (OXY IR/ROXICODONE) 5 MG immediate release tablet Take 1-2 tablets (5-10 mg total) by mouth every 4 (four) hours as needed for moderate pain. 04/11/22   Dian Queen, MD  traZODone (DESYREL) 50 MG tablet Take 0.5-1 tablets (25-50 mg total) by mouth at bedtime as needed for sleep. 09/17/18   Owens Loffler, MD      Allergies    Patient has no known allergies.    Review of Systems   Review of Systems  Constitutional:  Negative for chills and fever.  Respiratory:  Negative for shortness of breath.   Cardiovascular:  Negative for chest pain.  Gastrointestinal:  Positive for abdominal pain. Negative for diarrhea, nausea and vomiting.  Genitourinary:  Positive for flank pain and hematuria. Negative for dysuria, vaginal bleeding and vaginal pain.  Musculoskeletal:  Positive for back pain.  All other systems reviewed and are negative.   Physical Exam Updated Vital Signs BP (!) 123/97   Pulse 70   Temp 98 F (36.7 C)   Resp 20   Ht 5' 4"  (1.626 m)   Wt 53.1 kg   LMP 03/16/2022 (Approximate)   SpO2 100%   BMI 20.08 kg/m  Physical Exam Vitals and nursing note reviewed.  Constitutional:      General: She is not in acute distress.    Appearance: She is not ill-appearing.  HENT:     Head: Normocephalic.  Eyes:     Pupils: Pupils are equal, round, and reactive  to light.  Cardiovascular:     Rate and Rhythm: Normal rate and regular rhythm.     Pulses: Normal pulses.     Heart sounds: Normal heart sounds. No murmur heard.    No friction rub. No gallop.  Pulmonary:     Effort: Pulmonary effort is normal.     Breath sounds: Normal breath sounds.  Abdominal:     General: Abdomen is flat. There is no distension.     Palpations: Abdomen is soft.     Tenderness: There is abdominal tenderness. There is no guarding or rebound.     Comments: Mild tenderness on left side of abdomen  Musculoskeletal:        General: Normal range of motion.      Cervical back: Neck supple.  Skin:    General: Skin is warm and dry.  Neurological:     General: No focal deficit present.     Mental Status: She is alert.  Psychiatric:        Mood and Affect: Mood normal.        Behavior: Behavior normal.     ED Results / Procedures / Treatments   Labs (all labs ordered are listed, but only abnormal results are displayed) Labs Reviewed  URINALYSIS, ROUTINE W REFLEX MICROSCOPIC - Abnormal; Notable for the following components:      Result Value   Protein, ur >=300 (*)    All other components within normal limits  CBC WITH DIFFERENTIAL/PLATELET - Abnormal; Notable for the following components:   Hemoglobin 11.0 (*)    HCT 33.0 (*)    Platelets 477 (*)    Eosinophils Absolute 0.9 (*)    All other components within normal limits  COMPREHENSIVE METABOLIC PANEL - Abnormal; Notable for the following components:   Glucose, Bld 107 (*)    BUN 5 (*)    AST 6 (*)    All other components within normal limits  URINE CULTURE  LIPASE, BLOOD    EKG None  Radiology CT ABDOMEN PELVIS W CONTRAST  Result Date: 10/05/2022 CLINICAL DATA:  Left-sided pain for 2 days EXAM: CT ABDOMEN AND PELVIS WITH CONTRAST TECHNIQUE: Multidetector CT imaging of the abdomen and pelvis was performed using the standard protocol following bolus administration of intravenous contrast. RADIATION DOSE REDUCTION: This exam was performed according to the departmental dose-optimization program which includes automated exposure control, adjustment of the mA and/or kV according to patient size and/or use of iterative reconstruction technique. CONTRAST:  12m OMNIPAQUE IOHEXOL 300 MG/ML  SOLN COMPARISON:  None Available. FINDINGS: Lower chest: No acute abnormality. Hepatobiliary: No focal liver abnormality is seen. No gallstones, gallbladder wall thickening, or biliary dilatation. Pancreas: Unremarkable. No pancreatic ductal dilatation or surrounding inflammatory changes. Spleen: Normal in  size without focal abnormality. Adrenals/Urinary Tract: The adrenal glands are unremarkable. In the superolateral left kidney, there is a masslike area of poor enhancement that appears edematous (series 4, image 96). The right kidney is unremarkable. No nephrolithiasis or hydronephrosis. The bladder is unremarkable for degree of distension. Stomach/Bowel: Stomach is within normal limits. Appendix is not visualized. No evidence of bowel wall thickening, distention, or inflammatory changes. Vascular/Lymphatic: No significant vascular findings are present. Suppurative lymph node between the aorta and left kidney (series 2, image 24), which measures up to 1.8 cm (series 4, image 84). Additional prominent left periaortic lymph nodes that are not of abnormal density. No enlarged pelvic lymph nodes. Reproductive: The uterus is unremarkable. Small cysts in the bilateral ovaries, measuring  up to 1.9 cm on the right and 1.0 cm on the left. No follow-up imaging recommended. Reference: JACR 2020 Feb; 17(2):248-254 Other: No free fluid or free air in the abdomen or pelvis. Musculoskeletal: No acute or significant osseous findings. IMPRESSION: 1. Masslike area of poor enhancement in the superolateral left kidney, which appears edematous, concerning for pyelonephritis. Recommend correlation with urinalysis and consider follow-up imaging to confirm resolution. 2. Suppurative lymph node between the aorta and left kidney, which measures up to 1.8 cm. Additional prominent left periaortic lymph nodes that are not of abnormal density, likely reactive. Recommend attention on follow-up. Electronically Signed   By: Merilyn Baba M.D.   On: 10/05/2022 14:23    Procedures Procedures    Medications Ordered in ED Medications  iohexol (OMNIPAQUE) 300 MG/ML solution 100 mL (100 mLs Intravenous Contrast Given 10/05/22 1410)  cefTRIAXone (ROCEPHIN) 1 g in sodium chloride 0.9 % 100 mL IVPB (0 g Intravenous Stopped 10/05/22 1748)  sodium  chloride 0.9 % bolus 1,000 mL (1,000 mLs Intravenous New Bag/Given 10/05/22 1624)  morphine (PF) 4 MG/ML injection 4 mg (4 mg Intravenous Given 10/05/22 1623)    ED Course/ Medical Decision Making/ A&P                           Medical Decision Making Amount and/or Complexity of Data Reviewed Independent Historian: spouse    Details: Husband at bedside provided history Labs: ordered. Decision-making details documented in ED Course. Radiology: ordered and independent interpretation performed. Decision-making details documented in ED Course.  Risk Prescription drug management.   46 year old female presents to the ED due to left lower abdominal pain and flank pain x1 week.  History of UC. Recent hysterectomy 6 months ago. Patient sent by OB/GYN for further evaluation.  She had a transvaginal ultrasound today which showed a small endometrioma.  She had 1 episode of hematuria 1 week ago however, none since.  No history of kidney stones.  History of ulcerative colitis.  Upon arrival, patient afebrile, not tachycardic or hypoxic.  Patient in no acute distress.  Physical exam significant for mild tenderness on left side of abdomen.  Positive left CVA tenderness.  Patient in no acute distress.  Routine labs ordered.  CT abdomen to rule out diverticulitis vs. Pyelonephritis vs. kidney stone.  Routine labs ordered.  CBC reassuring.  No leukocytosis.  Mild anemia with hemoglobin at 11.  CMP reassuring.  No major electrolyte derangements.  UA significant for proteinuria.  No evidence of infection however, patient has been on ciprofloxacin for 2.5 days.  Will send for urine culture.  Lipase normal.  Doubt pancreatitis.  CT abdomen personally reviewed which demonstrates masslike area of poor enhancement in the left kidney likely due to pyelonephritis.  Also shows some enlarged lymph nodes.  Suspect patient has pyelonephritis however, no longer has infectious urine given she has been on antibiotics for 2.5 days.   Will treat with IV Rocephin, fluids, and pain medication and reassess patient.  Discussed with radiology about CT findings, no evidence of abscess.  Upon reassessment, patient admits to improvement in pain. Shared decision making in regards to admission for IV antibiotics vs. Continued outpatient antibiotic therapy and patient prefers to be discharged which I find to be reasonable given no evidence of sepsis. Suspect ciprofloxacin is working given unremarkable UA. Urine culture pending. Advised patient to follow-up with OBGYN within the next few days for recheck.  Will discharge patient with short course  pain medication and nausea medication. Strict ED precautions discussed with patient. Patient states understanding and agrees to plan. Patient discharged home in no acute distress and stable vitals  Discussed with Dr. Jeanell Sparrow who agrees with assessment and plan.        Final Clinical Impression(s) / ED Diagnoses Final diagnoses:  Pyelonephritis    Rx / DC Orders ED Discharge Orders          Ordered    HYDROcodone-acetaminophen (NORCO/VICODIN) 5-325 MG tablet  Every 6 hours PRN        10/05/22 1814    ondansetron (ZOFRAN-ODT) 4 MG disintegrating tablet  Every 8 hours PRN        10/05/22 1814              Suzy Bouchard, PA-C 10/05/22 1817    Pattricia Boss, MD 10/06/22 [phone]

## 2022-10-05 NOTE — ED Provider Triage Note (Signed)
Emergency Medicine Provider Triage Evaluation Note  Barbara Nicholson , a 46 y.o. female  was evaluated in triage.  Pt complains of left lower quadrant abdominal pain x1 week that radiates into the flank and back.  Patient evaluated by OB/GYN earlier today and was found to have a left endometrioma which OB/GYN did not think was causing patient's pain.  No history of kidney stones.  Admits to hematuria roughly 1 week ago however, none since.  No dysuria.  No vaginal discharge.  She has had a previous hysterectomy and cesarean section however, no other abdominal operations.  Review of Systems  Positive: Abdominal pain Negative: fever  Physical Exam  BP 116/76 (BP Location: Left Arm)   Pulse 62   Temp 98.5 F (36.9 C) (Oral)   Resp 16   Ht 5' 4"  (1.626 m)   Wt 53.1 kg   LMP 03/16/2022 (Approximate)   SpO2 100%   BMI 20.08 kg/m  Gen:   Awake, no distress   Resp:  Normal effort  MSK:   Moves extremities without difficulty  Other:    Medical Decision Making  Medically screening exam initiated at 12:23 PM.  Appropriate orders placed.  LASANDRA BATLEY was informed that the remainder of the evaluation will be completed by another provider, this initial triage assessment does not replace that evaluation, and the importance of remaining in the ED until their evaluation is complete.  Labs UA CT abdomen   Suzy Bouchard, PA-C 10/05/22 1224

## 2022-10-05 NOTE — Discharge Instructions (Addendum)
It was a pleasure taking care of you today.  As discussed, your CT scan showed signs of a kidney infection.  Continue taking your ciprofloxacin as previously prescribed.  Your urine culture results should be available within the next few days.  I am sending you home with pain medication and nausea medication.  Take as needed.  Pain medication can cause drowsiness so do not drive or operate machinery while on the medication.  Follow-up with OB/GYN if symptoms do not improved.  Return to the ER for new or worsening symptoms.

## 2022-10-06 LAB — URINE CULTURE: Culture: NO GROWTH

## 2022-10-07 ENCOUNTER — Encounter (HOSPITAL_BASED_OUTPATIENT_CLINIC_OR_DEPARTMENT_OTHER): Payer: Self-pay | Admitting: Emergency Medicine

## 2022-10-07 ENCOUNTER — Emergency Department (HOSPITAL_BASED_OUTPATIENT_CLINIC_OR_DEPARTMENT_OTHER): Payer: No Typology Code available for payment source

## 2022-10-07 ENCOUNTER — Other Ambulatory Visit: Payer: Self-pay

## 2022-10-07 ENCOUNTER — Emergency Department (HOSPITAL_BASED_OUTPATIENT_CLINIC_OR_DEPARTMENT_OTHER)
Admission: EM | Admit: 2022-10-07 | Discharge: 2022-10-07 | Disposition: A | Discharge status: Home / Self Care | Payer: No Typology Code available for payment source | Attending: Emergency Medicine | Admitting: Emergency Medicine

## 2022-10-07 DIAGNOSIS — D72829 Elevated white blood cell count, unspecified: Secondary | ICD-10-CM | POA: Insufficient documentation

## 2022-10-07 DIAGNOSIS — N10 Acute tubulo-interstitial nephritis: Secondary | ICD-10-CM | POA: Diagnosis not present

## 2022-10-07 DIAGNOSIS — N12 Tubulo-interstitial nephritis, not specified as acute or chronic: Secondary | ICD-10-CM

## 2022-10-07 DIAGNOSIS — R1032 Left lower quadrant pain: Secondary | ICD-10-CM | POA: Diagnosis present

## 2022-10-07 LAB — URINALYSIS, MICROSCOPIC (REFLEX)

## 2022-10-07 LAB — URINALYSIS, ROUTINE W REFLEX MICROSCOPIC
Bilirubin Urine: NEGATIVE
Glucose, UA: NEGATIVE mg/dL
Ketones, ur: NEGATIVE mg/dL
Nitrite: NEGATIVE
Protein, ur: 100 mg/dL — AB
Specific Gravity, Urine: <=1.005 (ref 1.005–1.030)
pH: 6.5 (ref 5.0–8.0)

## 2022-10-07 LAB — CBC WITH DIFFERENTIAL/PLATELET
Abs Immature Granulocytes: 0.04 10*3/uL (ref 0.00–0.07)
Basophils Absolute: 0.1 10*3/uL (ref 0.0–0.1)
Basophils Relative: 1 %
Eosinophils Absolute: 1.5 10*3/uL — ABNORMAL HIGH (ref 0.0–0.5)
Eosinophils Relative: 13 %
HCT: 32.8 % — ABNORMAL LOW (ref 36.0–46.0)
Hemoglobin: 10.9 g/dL — ABNORMAL LOW (ref 12.0–15.0)
Immature Granulocytes: 0 %
Lymphocytes Relative: 13 %
Lymphs Abs: 1.4 10*3/uL (ref 0.7–4.0)
MCH: 27.9 pg (ref 26.0–34.0)
MCHC: 33.2 g/dL (ref 30.0–36.0)
MCV: 84.1 fL (ref 80.0–100.0)
Monocytes Absolute: 0.6 10*3/uL (ref 0.1–1.0)
Monocytes Relative: 6 %
Neutro Abs: 7.7 10*3/uL (ref 1.7–7.7)
Neutrophils Relative %: 67 %
Platelets: 498 10*3/uL — ABNORMAL HIGH (ref 150–400)
RBC: 3.9 MIL/uL (ref 3.87–5.11)
RDW: 12.7 % (ref 11.5–15.5)
WBC: 11.4 10*3/uL — ABNORMAL HIGH (ref 4.0–10.5)
nRBC: 0 % (ref 0.0–0.2)

## 2022-10-07 LAB — COMPREHENSIVE METABOLIC PANEL
ALT: 16 U/L (ref 0–44)
AST: 10 U/L — ABNORMAL LOW (ref 15–41)
Albumin: 3.5 g/dL (ref 3.5–5.0)
Alkaline Phosphatase: 63 U/L (ref 38–126)
Anion gap: 7 (ref 5–15)
BUN: 6 mg/dL (ref 6–20)
CO2: 28 mmol/L (ref 22–32)
Calcium: 8.8 mg/dL — ABNORMAL LOW (ref 8.9–10.3)
Chloride: 100 mmol/L (ref 98–111)
Creatinine, Ser: 0.52 mg/dL (ref 0.44–1.00)
GFR, Estimated: >60 mL/min (ref >60)
Glucose, Bld: 95 mg/dL (ref 70–99)
Potassium: 3.5 mmol/L (ref 3.5–5.1)
Sodium: 135 mmol/L (ref 135–145)
Total Bilirubin: 0.5 mg/dL (ref 0.3–1.2)
Total Protein: 7.1 g/dL (ref 6.5–8.1)

## 2022-10-07 LAB — LIPASE, BLOOD: Lipase: 28 U/L (ref 11–51)

## 2022-10-07 MED ORDER — SODIUM CHLORIDE 0.9 % IV SOLN
1.0000 g | INTRAVENOUS | Status: DC
  Administered 2022-10-07: 1 g via INTRAVENOUS
  Filled 2022-10-07: qty 10

## 2022-10-07 MED ORDER — KETOROLAC TROMETHAMINE 15 MG/ML IJ SOLN
15.0000 mg | Freq: Once | INTRAMUSCULAR | Status: AC
Start: 2022-10-07 — End: 2022-10-07
  Administered 2022-10-07: 15 mg via INTRAVENOUS
  Filled 2022-10-07: qty 1

## 2022-10-07 MED ORDER — SODIUM CHLORIDE 0.9 % IV SOLN: INTRAVENOUS | Status: DC | PRN

## 2022-10-07 MED ORDER — IOHEXOL 300 MG/ML  SOLN: 80.0000 mL | Freq: Once | INTRAMUSCULAR | Status: DC | PRN

## 2022-10-07 MED ORDER — CEFDINIR 300 MG PO CAPS: 300.0000 mg | ORAL_CAPSULE | Freq: Two times a day (BID) | ORAL | 0 refills | Status: AC

## 2022-10-07 MED ORDER — ACETAMINOPHEN 500 MG PO TABS
1000.0000 mg | ORAL_TABLET | Freq: Once | ORAL | Status: AC
Start: 2022-10-07 — End: 2022-10-07
  Administered 2022-10-07: 1000 mg via ORAL
  Filled 2022-10-07: qty 2

## 2022-10-07 NOTE — ED Triage Notes (Signed)
Pt arrives pov, slow gait c/o LLQ pain radiating to LT flank, recent tx for same

## 2022-10-07 NOTE — ED Provider Notes (Signed)
[facility] Provider Note  CSN: [id number] Arrival date & time: 10/07/22 1038  Chief Complaint(s) Abdominal Pain  HPI Barbara Nicholson is a 46 y.o. female with a history of ulcerative colitis, otherwise healthy presenting with left flank and left lower quadrant pain.  Patient was recently treated for urine infection and seen in the emergency department few days ago.  She had a CT scan which showed possible pyelonephritis although she did not have any urinary symptoms.  She received a dose of IV ceftriaxone.  She was discharged, she reports that following discharge her symptoms improved, however last night they returned.  She denies any nausea or vomiting, fevers or chills, diarrhea.  She denies any dysuria.  She reports the pain is moderate, 6 out of 10.   Past Medical History Past Medical History:  Diagnosis Date   AMA (advanced maternal age) multigravida 35+    Anemia    hx of anemia due to fibroids   B12 deficiency 2015   Concussion with < 1 hr loss of consciousness 09/10/2018   Patient fell at home and hit her head. HA, confusion, amnesia around the time of the accident.   Generalized anxiety disorder 2021   work-related, better as of 04/06/22   Heart murmur    as a baby   Hx of ulcerative colitis    Syncope 03/14/2014   syncope after epidsode of explosive diarrhea   Ulcerative colitis ([place])    hx of left sided colitis 05/23/18   Wears contact lenses    Wears glasses    Patient Active Problem List   Diagnosis Date Noted   Fibroids 04/10/2022   Low vitamin B12 level 04/21/2014   Low ferritin level 04/21/2014   Hair thinning 04/21/2014   Subclinical hyperthyroidism 04/20/2014   FACIAL PARESTHESIA 11/22/2010   COLITIS, ULCERATIVE 11/20/2010   Home Medication(s) Prior to Admission medications   Medication Sig Start Date End Date Taking? Authorizing Provider  cefdinir (OMNICEF) 300 MG capsule Take 1 capsule (300 mg total) by mouth 2 (two) times  daily for 10 days. 10/07/22 10/17/22 Yes Cristie Hem, MD  HYDROcodone-acetaminophen (NORCO/VICODIN) 5-325 MG tablet Take 1 tablet by mouth every 6 (six) hours as needed for severe pain. 10/05/22   Suzy Bouchard, PA-C  ibuprofen (ADVIL) 600 MG tablet Take 1 tablet (600 mg total) by mouth every 6 (six) hours. 04/11/22   Dian Queen, MD  Omega-3 Fatty Acids (OMEGA-3 CF PO) Take by mouth. With D3 for  dry eyes    [provider]  ondansetron (ZOFRAN-ODT) 4 MG disintegrating tablet Take 1 tablet (4 mg total) by mouth every 8 (eight) hours as needed for nausea or vomiting. 10/05/22   Suzy Bouchard, PA-C  oxyCODONE (OXY IR/ROXICODONE) 5 MG immediate release tablet Take 1-2 tablets (5-10 mg total) by mouth every 4 (four) hours as needed for moderate pain. 04/11/22   Dian Queen, MD  traZODone (DESYREL) 50 MG tablet Take 0.5-1 tablets (25-50 mg total) by mouth at bedtime as needed for sleep. 09/17/18   Owens Loffler, MD  Past Surgical History Past Surgical History:  Procedure Laterality Date   ABDOMINAL HYSTERECTOMY     CESAREAN SECTION N/A 10/13/2013   Procedure: Primary Cesarean Section Delivery Baby  Girl  @ 0122, Apgars 9/9   ;  Surgeon: Cyril Mourning, MD;  Location: [facility];  Service: Obstetrics;  Laterality: N/A;   COLONOSCOPY  2020   HYSTERECTOMY ABDOMINAL WITH SALPINGECTOMY N/A 04/10/2022   Procedure: HYSTERECTOMY ABDOMINAL WITH BILATERAL SALPINGECTOMY;  Surgeon: Dian Queen, MD;  Location: [place];  Service: Gynecology;  Laterality: N/A;   UTERINE SEPTUM RESECTION     around 2006   WISDOM TOOTH EXTRACTION  2002   Family History Family History  Problem Relation Age of Onset   Mesothelioma Mother     Social History Social History   Tobacco Use   Smoking status: Never   Smokeless tobacco: Never   Vaping Use   Vaping Use: Never used  Substance Use Topics   Alcohol use: Yes    Comment: Socially    Drug use: No   Allergies Patient has no known allergies.  Review of Systems Review of Systems  Physical Exam Vital Signs  I have reviewed the triage vital signs BP 106/71   Pulse 64   Temp 97.9 F (36.6 C) (Oral)   Resp 18   LMP 03/16/2022 (Approximate)   SpO2 100%  Physical Exam Vitals and nursing note reviewed.  Constitutional:      General: She is not in acute distress.    Appearance: She is well-developed.  HENT:     Head: Normocephalic and atraumatic.     Mouth/Throat:     Mouth: Mucous membranes are moist.  Eyes:     Pupils: Pupils are equal, round, and reactive to light.  Cardiovascular:     Rate and Rhythm: Normal rate and regular rhythm.     Heart sounds: No murmur heard. Pulmonary:     Effort: Pulmonary effort is normal. No respiratory distress.     Breath sounds: Normal breath sounds.  Abdominal:     General: Abdomen is flat.     Palpations: Abdomen is soft.     Tenderness: There is abdominal tenderness (very mild) in the left lower quadrant. There is no left CVA tenderness.  Musculoskeletal:        General: No tenderness.     Right lower leg: No edema.     Left lower leg: No edema.  Skin:    General: Skin is warm and dry.  Neurological:     General: No focal deficit present.     Mental Status: She is alert. Mental status is at baseline.  Psychiatric:        Mood and Affect: Mood normal.        Behavior: Behavior normal.     ED Results and Treatments Labs (all labs ordered are listed, but only abnormal results are displayed) Labs Reviewed  URINALYSIS, ROUTINE W REFLEX MICROSCOPIC - Abnormal; Notable for the following components:      Result Value   Hgb urine dipstick SMALL (*)    Protein, ur 100 (*)    Leukocytes,Ua TRACE (*)    All other components within normal limits  URINALYSIS, MICROSCOPIC (REFLEX) - Abnormal; Notable for the  following components:   Bacteria, UA RARE (*)    All other components within normal limits  COMPREHENSIVE METABOLIC PANEL - Abnormal; Notable for the following components:   Calcium 8.8 (*)    AST 10 (*)  All other components within normal limits  CBC WITH DIFFERENTIAL/PLATELET - Abnormal; Notable for the following components:   WBC 11.4 (*)    Hemoglobin 10.9 (*)    HCT 32.8 (*)    Platelets 498 (*)    Eosinophils Absolute 1.5 (*)    All other components within normal limits  URINE CULTURE  LIPASE, BLOOD                                                                                                                          Radiology US Renal  Result Date: 10/07/2022 CLINICAL DATA:  Left flank pain.  Follow-up abnormal CT scan. EXAM: RENAL / URINARY TRACT ULTRASOUND COMPLETE COMPARISON:  CT, 10/05/2022. FINDINGS: Right Kidney: Renal measurements: 11.2 x 4.2 x 3.9 cm = volume: 97 mL. Echogenicity within normal limits. No mass or hydronephrosis visualized. Left Kidney: Renal measurements: 11.3 x 5.9 x 4.7 cm = volume: 163 mL. No defined mass. Area of relative hyperechogenicity and the minutes vascularity in the mid to upper pole of the left kidney corresponds to the area of hypoattenuation on CT. This is suspicious for pyelonephritis. No stones. No hydronephrosis. Bladder: Appears normal for degree of bladder distention. Other: None. IMPRESSION: 1. Sonographic findings support focal pyelonephritis of the left kidney as the cause of the masslike area noted on the recent prior CT. However, if this is not consistent clinically, recommend follow-up renal MRI without and with contrast for further assessment. Electronically Signed   By: Lajean Manes M.D.   On: 10/07/2022 14:05    Pertinent labs & imaging results that were available during my care of the patient were reviewed by me and considered in my medical decision making (see MDM for details).  Medications Ordered in ED Medications   iohexol (OMNIPAQUE) 300 MG/ML solution 80 mL (has no administration in time range)  cefTRIAXone (ROCEPHIN) 1 g in sodium chloride 0.9 % 100 mL IVPB (0 g Intravenous Stopped 10/07/22 1435)  0.9 %  sodium chloride infusion ( Intravenous Stopped 10/07/22 1457)  acetaminophen (TYLENOL) tablet 1,000 mg (1,000 mg Oral Given 10/07/22 1154)  ketorolac (TORADOL) 15 MG/ML injection 15 mg (15 mg Intravenous Given 10/07/22 1216)                                                                                                                                     Procedures .1-3 Lead EKG  Interpretation  Performed by: Cristie Hem, MD Authorized by: Cristie Hem, MD     Interpretation: normal     ECG rate:  70   ECG rate assessment: normal     Rhythm: sinus rhythm     Ectopy: none     Conduction: normal     (including critical care time)  Medical Decision Making / ED Course   MDM:  46 year old female presenting with left-sided pain.  Reviewed previous records including ED visit from 2 days ago.  CT scan at that time unusual, demonstrating possible focal pyelonephritis with possible suppurative lymph node.  She does report her symptoms got better with IV antibiotics but since discharge they have recurred.  Repeat urinalysis today does demonstrate some bacteria and trace leukocytes.  This could be evidence of ongoing urinary infection not  adequately treated by outpatient ciprofloxacin.  Given worsening pain, focal pyelonephritis may have transition to abscess, so will obtain repeat CT scan to evaluate for any changes given unusual initial imaging.  Differential also includes diverticulitis, colitis, ulcerative colitis flare but lower concern.  We will follow-up on CT scan.  Will obtain basic labs as well as urinalysis. Will re-assess  Clinical Course as of 10/07/22 1556  Sun Oct 07, 2022  1214 Platelets(!): 498 [WS]  1214 WBC(!): 11.4 Elevated compared to 2 days ago [WS]  1324 Urine  Culture Patient's primary care physician called, request not to perform CT scan as she recently had CT scan.  Patient also uncomfortable with CT scan so quickly after initial scan.  We will proceed with renal ultrasound given patient has no fevers or signs of systemic illness and determine course after renal ultrasound [WS]  1454 Discussed results with patient.  I independently reviewed ultrasound which appears to show process such as focal pyelonephritis.  I discussed the results with the patient and her primary care doctor over the phone.  Given patient is overall well-appearing, stable vital signs, lower concern for abscess, patient also has minimal CVA tenderness.  Will prescribe cefdinir as patient did improve with ceftriaxone.  Advised strict return precautions.  Discussed that patient may need MRI after her treatment in order to exclude underlying renal mass. Will discharge patient to home. All questions answered. Patient comfortable with plan of discharge. Return precautions discussed with patient and specified on the after visit summary.  [WS]    Clinical Course User Index [WS] Cristie Hem, MD     Additional history obtained: -Additional history obtained from primary care physician, over phone -External records from outside source obtained and reviewed including: Chart review including previous notes, labs, imaging, consultation notes including recent ER visit 2 days ago   Lab Tests: -I ordered, reviewed, and interpreted labs.   The pertinent results include:   Labs Reviewed  URINALYSIS, ROUTINE W REFLEX MICROSCOPIC - Abnormal; Notable for the following components:      Result Value   Hgb urine dipstick SMALL (*)    Protein, ur 100 (*)    Leukocytes,Ua TRACE (*)    All other components within normal limits  URINALYSIS, MICROSCOPIC (REFLEX) - Abnormal; Notable for the following components:   Bacteria, UA RARE (*)    All other components within normal limits  COMPREHENSIVE  METABOLIC PANEL - Abnormal; Notable for the following components:   Calcium 8.8 (*)    AST 10 (*)    All other components within normal limits  CBC WITH DIFFERENTIAL/PLATELET - Abnormal; Notable for the following components:   WBC  11.4 (*)    Hemoglobin 10.9 (*)    HCT 32.8 (*)    Platelets 498 (*)    Eosinophils Absolute 1.5 (*)    All other components within normal limits  URINE CULTURE  LIPASE, BLOOD    Notable for elevated WBC count, bacturia      Imaging Studies ordered: I ordered imaging studies including renal ultrasound On my interpretation imaging demonstrates focal pyelonephritis/mass I independently visualized and interpreted imaging. I agree with the radiologist interpretation   Medicines ordered and prescription drug management: Meds ordered this encounter  Medications   acetaminophen (TYLENOL) tablet 1,000 mg   ketorolac (TORADOL) 15 MG/ML injection 15 mg   iohexol (OMNIPAQUE) 300 MG/ML solution 80 mL   cefTRIAXone (ROCEPHIN) 1 g in sodium chloride 0.9 % 100 mL IVPB    Order Specific Question:   Antibiotic Indication:    Answer:   UTI   0.9 %  sodium chloride infusion    Carrier Fluid Protocol   cefdinir (OMNICEF) 300 MG capsule    Sig: Take 1 capsule (300 mg total) by mouth 2 (two) times daily for 10 days.    Dispense:  20 capsule    Refill:  0    -I have reviewed the patients home medicines and have made adjustments as needed    Cardiac Monitoring: The patient was maintained on a cardiac monitor.  I personally viewed and interpreted the cardiac monitored which showed an underlying rhythm of: NSR \ Reevaluation: After the interventions noted above, I reevaluated the patient and found that they have improved  Co morbidities that complicate the patient evaluation  Past Medical History:  Diagnosis Date   AMA (advanced maternal age) multigravida 35+    Anemia    hx of anemia due to fibroids   B12 deficiency 2015   Concussion with < 1 hr loss of  consciousness 09/10/2018   Patient fell at home and hit her head. HA, confusion, amnesia around the time of the accident.   Generalized anxiety disorder 2021   work-related, better as of 04/06/22   Heart murmur    as a baby   Hx of ulcerative colitis    Syncope 03/14/2014   syncope after epidsode of explosive diarrhea   Ulcerative colitis ([place])    hx of left sided colitis 05/23/18   Wears contact lenses    Wears glasses       Dispostion: Disposition decision including need for hospitalization was considered, and patient discharged from emergency department.    Final Clinical Impression(s) / ED Diagnoses Final diagnoses:  Focal pyelonephritis     This chart was dictated using voice recognition software.  Despite best efforts to proofread,  errors can occur which can change the documentation meaning.    Cristie Hem, MD 10/07/22 1556

## 2022-10-07 NOTE — Discharge Instructions (Signed)
We evaluated you for your flank and abdominal pain.  Your lab tests today do show some signs of urine infection and we sent a urine culture.  Your lab test otherwise show a mild elevation in your WBC count but normal kidney function and electrolytes.  We obtained an ultrasound which shows findings that appear unchanged compared to your recent CT scan.   Your symptoms are most likely due to focal pyelonephritis of the left kidney.  Please follow-up closely with your primary doctor for repeat imaging such as renal MRI to rule out underlying mass.  Please return to the emergency department if you develop fevers, worsening pain, vomiting, or your symptoms fail to improve.

## 2022-10-08 LAB — URINE CULTURE: Culture: NO GROWTH

## 2022-10-12 ENCOUNTER — Other Ambulatory Visit (HOSPITAL_BASED_OUTPATIENT_CLINIC_OR_DEPARTMENT_OTHER): Payer: Self-pay | Admitting: Family Medicine

## 2022-10-12 ENCOUNTER — Ambulatory Visit (HOSPITAL_BASED_OUTPATIENT_CLINIC_OR_DEPARTMENT_OTHER)
Admission: RE | Admit: 2022-10-12 | Discharge: 2022-10-12 | Disposition: A | Discharge status: Home / Self Care | Payer: No Typology Code available for payment source | Source: Ambulatory Visit | Attending: Family Medicine | Admitting: Family Medicine

## 2022-10-12 DIAGNOSIS — N12 Tubulo-interstitial nephritis, not specified as acute or chronic: Secondary | ICD-10-CM

## 2022-10-16 ENCOUNTER — Encounter (HOSPITAL_COMMUNITY): Payer: Self-pay

## 2022-10-16 ENCOUNTER — Emergency Department (HOSPITAL_COMMUNITY)
Admission: EM | Admit: 2022-10-16 | Discharge: 2022-10-17 | Disposition: A | Discharge status: Home / Self Care | Payer: No Typology Code available for payment source | Attending: Emergency Medicine | Admitting: Emergency Medicine

## 2022-10-16 ENCOUNTER — Telehealth (HOSPITAL_BASED_OUTPATIENT_CLINIC_OR_DEPARTMENT_OTHER): Payer: Self-pay

## 2022-10-16 ENCOUNTER — Other Ambulatory Visit (HOSPITAL_BASED_OUTPATIENT_CLINIC_OR_DEPARTMENT_OTHER): Payer: Self-pay | Admitting: Family Medicine

## 2022-10-16 DIAGNOSIS — N9489 Other specified conditions associated with female genital organs and menstrual cycle: Secondary | ICD-10-CM | POA: Insufficient documentation

## 2022-10-16 DIAGNOSIS — D649 Anemia, unspecified: Secondary | ICD-10-CM | POA: Insufficient documentation

## 2022-10-16 DIAGNOSIS — R109 Unspecified abdominal pain: Secondary | ICD-10-CM

## 2022-10-16 DIAGNOSIS — R319 Hematuria, unspecified: Secondary | ICD-10-CM | POA: Diagnosis not present

## 2022-10-16 DIAGNOSIS — R112 Nausea with vomiting, unspecified: Secondary | ICD-10-CM | POA: Insufficient documentation

## 2022-10-16 DIAGNOSIS — R300 Dysuria: Secondary | ICD-10-CM | POA: Diagnosis not present

## 2022-10-16 DIAGNOSIS — R1032 Left lower quadrant pain: Secondary | ICD-10-CM | POA: Insufficient documentation

## 2022-10-16 DIAGNOSIS — N2889 Other specified disorders of kidney and ureter: Secondary | ICD-10-CM | POA: Diagnosis not present

## 2022-10-16 DIAGNOSIS — M549 Dorsalgia, unspecified: Secondary | ICD-10-CM | POA: Diagnosis not present

## 2022-10-16 DIAGNOSIS — N10 Acute tubulo-interstitial nephritis: Secondary | ICD-10-CM

## 2022-10-16 LAB — URINALYSIS, ROUTINE W REFLEX MICROSCOPIC
Bilirubin Urine: NEGATIVE
Glucose, UA: NEGATIVE mg/dL
Ketones, ur: NEGATIVE mg/dL
Nitrite: NEGATIVE
Protein, ur: 100 mg/dL — AB
Specific Gravity, Urine: 1.004 — ABNORMAL LOW (ref 1.005–1.030)
pH: 6 (ref 5.0–8.0)

## 2022-10-16 LAB — CBC
HCT: 26.9 % — ABNORMAL LOW (ref 36.0–46.0)
Hemoglobin: 9 g/dL — ABNORMAL LOW (ref 12.0–15.0)
MCH: 28.2 pg (ref 26.0–34.0)
MCHC: 33.5 g/dL (ref 30.0–36.0)
MCV: 84.3 fL (ref 80.0–100.0)
Platelets: 384 10*3/uL (ref 150–400)
RBC: 3.19 MIL/uL — ABNORMAL LOW (ref 3.87–5.11)
RDW: 12.4 % (ref 11.5–15.5)
WBC: 11.7 10*3/uL — ABNORMAL HIGH (ref 4.0–10.5)
nRBC: 0 % (ref 0.0–0.2)

## 2022-10-16 LAB — COMPREHENSIVE METABOLIC PANEL
ALT: 22 U/L (ref 0–44)
AST: 12 U/L — ABNORMAL LOW (ref 15–41)
Albumin: 3 g/dL — ABNORMAL LOW (ref 3.5–5.0)
Alkaline Phosphatase: 57 U/L (ref 38–126)
Anion gap: 7 (ref 5–15)
BUN: 12 mg/dL (ref 6–20)
CO2: 22 mmol/L (ref 22–32)
Calcium: 8.2 mg/dL — ABNORMAL LOW (ref 8.9–10.3)
Chloride: 102 mmol/L (ref 98–111)
Creatinine, Ser: 0.65 mg/dL (ref 0.44–1.00)
GFR, Estimated: >60 mL/min (ref >60)
Glucose, Bld: 109 mg/dL — ABNORMAL HIGH (ref 70–99)
Potassium: 3.8 mmol/L (ref 3.5–5.1)
Sodium: 131 mmol/L — ABNORMAL LOW (ref 135–145)
Total Bilirubin: 0.5 mg/dL (ref 0.3–1.2)
Total Protein: 6 g/dL — ABNORMAL LOW (ref 6.5–8.1)

## 2022-10-16 LAB — LIPASE, BLOOD: Lipase: 38 U/L (ref 11–51)

## 2022-10-16 LAB — I-STAT BETA HCG BLOOD, ED (MC, WL, AP ONLY): I-stat hCG, quantitative: <5 m[IU]/mL (ref <5)

## 2022-10-16 NOTE — ED Triage Notes (Addendum)
Pt seen on recently and was diagnosed with pyelonephritis. Pt states that the pain worsened 2 hours ago. Pt reports blood in urine. Pt  reports pain in left lower back radiating around flank to left lower abdomen. Pt reports taking zofran and ibuprofen about 1-2 hours ago.

## 2022-10-17 ENCOUNTER — Emergency Department (HOSPITAL_COMMUNITY): Payer: No Typology Code available for payment source

## 2022-10-17 ENCOUNTER — Other Ambulatory Visit: Payer: Self-pay | Admitting: Family Medicine

## 2022-10-17 ENCOUNTER — Encounter (HOSPITAL_COMMUNITY): Payer: Self-pay

## 2022-10-17 DIAGNOSIS — C649 Malignant neoplasm of unspecified kidney, except renal pelvis: Secondary | ICD-10-CM

## 2022-10-17 LAB — CBC WITH DIFFERENTIAL/PLATELET
Abs Immature Granulocytes: 0.03 10*3/uL (ref 0.00–0.07)
Basophils Absolute: 0.1 10*3/uL (ref 0.0–0.1)
Basophils Relative: 1 %
Eosinophils Absolute: 1 10*3/uL — ABNORMAL HIGH (ref 0.0–0.5)
Eosinophils Relative: 11 %
HCT: 28.3 % — ABNORMAL LOW (ref 36.0–46.0)
Hemoglobin: 9.1 g/dL — ABNORMAL LOW (ref 12.0–15.0)
Immature Granulocytes: 0 %
Lymphocytes Relative: 14 %
Lymphs Abs: 1.3 10*3/uL (ref 0.7–4.0)
MCH: 27.7 pg (ref 26.0–34.0)
MCHC: 32.2 g/dL (ref 30.0–36.0)
MCV: 86 fL (ref 80.0–100.0)
Monocytes Absolute: 0.5 10*3/uL (ref 0.1–1.0)
Monocytes Relative: 5 %
Neutro Abs: 6.5 10*3/uL (ref 1.7–7.7)
Neutrophils Relative %: 69 %
Platelets: 431 10*3/uL — ABNORMAL HIGH (ref 150–400)
RBC: 3.29 MIL/uL — ABNORMAL LOW (ref 3.87–5.11)
RDW: 12.6 % (ref 11.5–15.5)
WBC: 9.4 10*3/uL (ref 4.0–10.5)
nRBC: 0 % (ref 0.0–0.2)

## 2022-10-17 LAB — RETICULOCYTES
Immature Retic Fract: 7.3 % (ref 2.3–15.9)
RBC.: 3.22 MIL/uL — ABNORMAL LOW (ref 3.87–5.11)
Retic Count, Absolute: 26.4 10*3/uL (ref 19.0–186.0)
Retic Ct Pct: 0.8 % (ref 0.4–3.1)

## 2022-10-17 LAB — FOLATE: Folate: 6.2 ng/mL (ref >5.9)

## 2022-10-17 LAB — FERRITIN: Ferritin: 79 ng/mL (ref 11–307)

## 2022-10-17 LAB — IRON AND TIBC
Iron: 26 ug/dL — ABNORMAL LOW (ref 28–170)
Saturation Ratios: 9 % — ABNORMAL LOW (ref 10.4–31.8)
TIBC: 289 ug/dL (ref 250–450)
UIBC: 263 ug/dL

## 2022-10-17 LAB — VITAMIN B12: Vitamin B-12: 436 pg/mL (ref 180–914)

## 2022-10-17 MED ORDER — SODIUM CHLORIDE (PF) 0.9 % IJ SOLN
INTRAMUSCULAR | Status: AC
  Filled 2022-10-17: qty 50

## 2022-10-17 MED ORDER — ONDANSETRON HCL 4 MG/2ML IJ SOLN
4.0000 mg | Freq: Once | INTRAMUSCULAR | Status: AC
  Administered 2022-10-17: 4 mg via INTRAVENOUS
  Filled 2022-10-17: qty 2

## 2022-10-17 MED ORDER — SODIUM CHLORIDE 0.9 % IV BOLUS
1000.0000 mL | Freq: Once | INTRAVENOUS | Status: AC
  Administered 2022-10-17: 1000 mL via INTRAVENOUS

## 2022-10-17 MED ORDER — HYDROMORPHONE HCL 1 MG/ML IJ SOLN
1.0000 mg | Freq: Once | INTRAMUSCULAR | Status: AC
  Administered 2022-10-17: 1 mg via INTRAVENOUS
  Filled 2022-10-17: qty 1

## 2022-10-17 MED ORDER — IOHEXOL 300 MG/ML  SOLN
100.0000 mL | Freq: Once | INTRAMUSCULAR | Status: AC | PRN
  Administered 2022-10-17: 100 mL via INTRAVENOUS

## 2022-10-17 MED ORDER — OXYCODONE HCL 5 MG PO TABS: 5.0000 mg | ORAL_TABLET | ORAL | 0 refills | Status: DC | PRN

## 2022-10-17 NOTE — ED Notes (Signed)
RN called to add the urine culture

## 2022-10-17 NOTE — Progress Notes (Signed)
Received call from patient- She notes an apparent kidney cancer vs lymphoma on recent CT, would like my help getting in with Dr Marin Olp if possible for oncology care Urology appt is already set up for 10/27 Contacted hematology and placed urgent referral   Patient reports she was able to discuss with one of her oncology colleagues who recommended obtaining a CT chest.  We will order this for patient

## 2022-10-17 NOTE — ED Provider Notes (Signed)
[facility] DEPT Provider Note   CSN: [id number] Arrival date & time: 10/16/22  2159     History  Chief Complaint  Patient presents with   Abdominal Pain   Back Pain    Barbara Nicholson is a 46 y.o. female.  Patient as above with significant medical history as below, including recent hysterectomy, ulcerative colitis, anemia who presents to the ED with complaint of left-sided flank pain.  She was recently diagnosed with pyelonephritis, given Rocephin and started on cefdinir for home.  She is still taking the antibiotics as prescribed.  Symptoms have been improving over the past few days, the last 24 hours she has had worsening pain to her left flank, nausea and vomiting.  While she was in the emergency department she urinate and feels though she passed a "blood clot" in her pain medically improved following this.  No further nausea.  No ongoing vomiting.  No fevers past 24 hours.  No chest pain or dyspnea.  No abnormal vaginal bleeding or discharge.  No further blood produced with urination, dysuria also has subsided.  Pain primarily left flank, left lower quadrant.  Sharp, stabbing.  Was initially excruciating prior to arrival but has since improved.  Denies history of kidney stones.     Past Medical History:  Diagnosis Date   AMA (advanced maternal age) multigravida 35+    Anemia    hx of anemia due to fibroids   B12 deficiency 2015   Concussion with < 1 hr loss of consciousness 09/10/2018   Patient fell at home and hit her head. HA, confusion, amnesia around the time of the accident.   Generalized anxiety disorder 2021   work-related, better as of 04/06/22   Heart murmur    as a baby   Hx of ulcerative colitis    Syncope 03/14/2014   syncope after epidsode of explosive diarrhea   Ulcerative colitis ([place])    hx of left sided colitis 05/23/18   Wears contact lenses    Wears glasses     Past Surgical History:  Procedure Laterality Date   ABDOMINAL  HYSTERECTOMY     CESAREAN SECTION N/A 10/13/2013   Procedure: Primary Cesarean Section Delivery Baby  Girl  @ 0122, Apgars 9/9   ;  Surgeon: Cyril Mourning, MD;  Location: [facility];  Service: Obstetrics;  Laterality: N/A;   COLONOSCOPY  2020   HYSTERECTOMY ABDOMINAL WITH SALPINGECTOMY N/A 04/10/2022   Procedure: HYSTERECTOMY ABDOMINAL WITH BILATERAL SALPINGECTOMY;  Surgeon: Dian Queen, MD;  Location: [facility];  Service: Gynecology;  Laterality: N/A;   UTERINE SEPTUM RESECTION     around 2006   [place] EXTRACTION  2002     The history is provided by the patient. No language interpreter was used.  Abdominal Pain Associated symptoms: dysuria, hematuria, nausea and vomiting   Associated symptoms: no chest pain, no cough, no fever and no shortness of breath   Back Pain Associated symptoms: abdominal pain and dysuria   Associated symptoms: no chest pain, no fever and no headaches        Home Medications Prior to Admission medications   Medication Sig Start Date End Date Taking? Authorizing Provider  oxyCODONE (ROXICODONE) 5 MG immediate release tablet Take 1 tablet (5 mg total) by mouth every 4 (four) hours as needed for severe pain. 10/17/22  Yes Wynona Dove A, DO  cefdinir (OMNICEF) 300 MG capsule Take 1 capsule (300 mg total) by mouth 2 (two) times  daily for 10 days. 10/07/22 10/17/22  Cristie Hem, MD  ciprofloxacin (CIPRO) 500 MG tablet Take 500 mg by mouth 2 (two) times daily. 10/15/22   [provider]  HYDROcodone-acetaminophen (NORCO/VICODIN) 5-325 MG tablet Take 1 tablet by mouth every 6 (six) hours as needed for severe pain. 10/05/22   Suzy Bouchard, PA-C  ibuprofen (ADVIL) 600 MG tablet Take 1 tablet (600 mg total) by mouth every 6 (six) hours. 04/11/22   Dian Queen, MD  Omega-3 Fatty Acids (OMEGA-3 CF PO) Take by mouth. With D3 for  dry eyes    [provider]  ondansetron (ZOFRAN-ODT) 4 MG disintegrating tablet  Take 1 tablet (4 mg total) by mouth every 8 (eight) hours as needed for nausea or vomiting. 10/05/22   Suzy Bouchard, PA-C  oxyCODONE (OXY IR/ROXICODONE) 5 MG immediate release tablet Take 1-2 tablets (5-10 mg total) by mouth every 4 (four) hours as needed for moderate pain. 04/11/22   Dian Queen, MD  sulfamethoxazole-trimethoprim (BACTRIM DS) 800-160 MG tablet Take 1 tablet by mouth 2 (two) times daily. 10/07/22   [provider]  traZODone (DESYREL) 50 MG tablet Take 0.5-1 tablets (25-50 mg total) by mouth at bedtime as needed for sleep. 09/17/18   Owens Loffler, MD      Allergies    Patient has no known allergies.    Review of Systems   Review of Systems  Constitutional:  Negative for activity change and fever.  HENT:  Negative for facial swelling and trouble swallowing.   Eyes:  Negative for discharge and redness.  Respiratory:  Negative for cough and shortness of breath.   Cardiovascular:  Negative for chest pain and palpitations.  Gastrointestinal:  Positive for abdominal pain, nausea and vomiting.  Genitourinary:  Positive for dysuria, flank pain and hematuria.  Musculoskeletal:  Positive for back pain. Negative for gait problem.  Skin:  Negative for pallor and rash.  Neurological:  Negative for syncope and headaches.    Physical Exam Updated Vital Signs BP 95/61   Pulse (!) 59   Temp 98.6 F (37 C) (Oral)   Resp 19   LMP 03/16/2022 (Approximate)   SpO2 100%  Physical Exam Vitals and nursing note reviewed.  Constitutional:      General: She is not in acute distress.    Appearance: Normal appearance. She is well-developed. She is not ill-appearing, toxic-appearing or diaphoretic.  HENT:     Head: Normocephalic and atraumatic.     Right Ear: External ear normal.     Left Ear: External ear normal.     Nose: Nose normal.     Mouth/Throat:     Mouth: Mucous membranes are moist.  Eyes:     General: No scleral icterus.       Right eye: No discharge.         Left eye: No discharge.  Cardiovascular:     Rate and Rhythm: Normal rate and regular rhythm.     Pulses: Normal pulses.     Heart sounds: Normal heart sounds.  Pulmonary:     Effort: Pulmonary effort is normal. No respiratory distress.     Breath sounds: Normal breath sounds.  Abdominal:     General: Abdomen is flat.     Tenderness: There is abdominal tenderness in the left lower quadrant. There is left CVA tenderness. There is no right CVA tenderness, guarding or rebound. Negative signs include Murphy's sign and McBurney's sign.  Musculoskeletal:  General: Normal range of motion.     Cervical back: Normal range of motion.     Right lower leg: No edema.     Left lower leg: No edema.  Skin:    General: Skin is warm and dry.     Capillary Refill: Capillary refill takes less than 2 seconds.  Neurological:     Mental Status: She is alert.  Psychiatric:        Mood and Affect: Mood normal.        Behavior: Behavior normal.     ED Results / Procedures / Treatments   Labs (all labs ordered are listed, but only abnormal results are displayed) Labs Reviewed  COMPREHENSIVE METABOLIC PANEL - Abnormal; Notable for the following components:      Result Value   Sodium 131 (*)    Glucose, Bld 109 (*)    Calcium 8.2 (*)    Total Protein 6.0 (*)    Albumin 3.0 (*)    AST 12 (*)    All other components within normal limits  CBC - Abnormal; Notable for the following components:   WBC 11.7 (*)    RBC 3.19 (*)    Hemoglobin 9.0 (*)    HCT 26.9 (*)    All other components within normal limits  URINALYSIS, ROUTINE W REFLEX MICROSCOPIC - Abnormal; Notable for the following components:   Color, Urine STRAW (*)    APPearance CLOUDY (*)    Specific Gravity, Urine 1.004 (*)    Hgb urine dipstick MODERATE (*)    Protein, ur 100 (*)    Leukocytes,Ua MODERATE (*)    Bacteria, UA RARE (*)    All other components within normal limits  IRON AND TIBC - Abnormal; Notable for the  following components:   Iron 26 (*)    Saturation Ratios 9 (*)    All other components within normal limits  RETICULOCYTES - Abnormal; Notable for the following components:   RBC. 3.22 (*)    All other components within normal limits  URINE CULTURE  LIPASE, BLOOD  VITAMIN B12  FOLATE  FERRITIN  DIFFERENTIAL  CBC  I-STAT BETA HCG BLOOD, ED ([facility], [facility], [facility] ONLY)    EKG None  Radiology CT ABDOMEN PELVIS W CONTRAST  Result Date: 10/17/2022 CLINICAL DATA:  46 year old female with abnormal left kidney recently, masslike and poorly enhancing. Gross hematuria. EXAM: CT ABDOMEN AND PELVIS WITH CONTRAST TECHNIQUE: Multidetector CT imaging of the abdomen and pelvis was performed using the standard protocol following bolus administration of intravenous contrast. RADIATION DOSE REDUCTION: This exam was performed according to the departmental dose-optimization program which includes automated exposure control, adjustment of the mA and/or kV according to patient size and/or use of iterative reconstruction technique. CONTRAST:  175m OMNIPAQUE IOHEXOL 300 MG/ML  SOLN COMPARISON:  CT Abdomen and Pelvis 10/05/2022. Follow-up renal ultrasound 10/12/2022. FINDINGS: Lower chest: Stable and well aerated lung bases. No pericardial or pleural effusion. However, there is a small but increasing left lower lobe posterior basal segment lung nodule which is now 3 mm on series 3, image 17 and was punctate earlier this month. But no other suspicious lung base nodule. Hepatobiliary: Liver is stable and within normal limits. Gallbladder is negative except for questionable cholelithiasis now on series 2, image 35, 4 mm and dependent. No gallbladder inflammation. Pancreas: Negative. Spleen: Negative. Adrenals/Urinary Tract: Adrenal glands remain within normal limits. Right kidney remains within normal limits. Upper pole left renal expansion with infiltrative masslike hypoenhancement in an area  of 47 by 59 x 77 mm (AP by transverse  by CC) has not improved and appears larger since 10/05/2022 (43 by 49 x 70 mm at that time). Furthermore, there is a paucity of pararenal space inflammation, but there are abnormal small vessels such as on coronal image 81. Similar appearance of the left renal collecting system, with some urothelial thickening but no convincing asymmetric hydroureter or hydronephrosis. Left renal level retroperitoneal lymphadenopathy persists (series 2, image 25 and 32), appears partially cystic or necrotic and mildly progressed. Unremarkable bladder.  Incidental pelvic phleboliths. Stomach/Bowel: Increased retained stool throughout redundant large bowel from earlier this month. No discrete large bowel inflammation identified. No dilated small bowel. Decompressed stomach and duodenum. No free air or free fluid. Vascular/Lymphatic: Major arterial structures in the abdomen and pelvis are patent and normal. Portal venous system is patent. Left retroperitoneal lymphadenopathy is described with the kidney above. No other lymphadenopathy. Reproductive: Absent uterus. 2.5 cm simple fluid density right ovarian, No follow-up imaging is recommended. Reference: JACR 2020 Feb;17(2):248-254. Left ovary is within normal limits. Other: No pelvic free fluid. Musculoskeletal: Stable visualized osseous structures. No acute or suspicious osseous lesion identified. IMPRESSION: 1. Enlargement of the infiltrative, poorly enhancing and mass-like abnormality of the left renal upper pole since 10/05/2022, along with progressed regional lymphadenopathy, and a paucity of pararenal space inflammation (but some evidence of abnormal neovascularity). The constellation is now more suspicious for Renal Neoplasm (including Renal Cell Carcinoma, Renal Lymphoma) than lobar nephronia or pyelonephritis. 2. Small but increasing and indeterminate left lower lobe posterior basal segment lung nodule, 3 mm. Attention on follow-up. 3. No other metastatic disease identified in  the abdomen or pelvis. Increased retained stool throughout redundant large bowel from earlier this month. Electronically Signed   By: Genevie Ann M.D.   On: 10/17/2022 04:30    Procedures Procedures    Medications Ordered in ED Medications  sodium chloride (PF) 0.9 % injection (has no administration in time range)  sodium chloride 0.9 % bolus 1,000 mL (0 mLs Intravenous Stopped 10/17/22 0610)  HYDROmorphone (DILAUDID) injection 1 mg (1 mg Intravenous Given 10/17/22 0328)  ondansetron (ZOFRAN) injection 4 mg (4 mg Intravenous Given 10/17/22 0329)  iohexol (OMNIPAQUE) 300 MG/ML solution 100 mL (100 mLs Intravenous Contrast Given 10/17/22 0354)    ED Course/ Medical Decision Making/ A&P                           Medical Decision Making Amount and/or Complexity of Data Reviewed Labs: ordered. Radiology: ordered.  Risk Prescription drug management.   This patient presents to the ED with chief complaint(s) of flank/llq pain/n/v with pertinent past medical history of pyelonephritis, hysterectomy which further complicates the presenting complaint. The complaint involves an extensive differential diagnosis and also carries with it a high risk of complications and morbidity.    Differential diagnosis includes but is not exclusive to ectopic pregnancy, ovarian cyst, ovarian torsion, acute appendicitis, urinary tract infection, endometriosis, bowel obstruction, hernia, colitis, renal colic, gastroenteritis, volvulus, pyelonephritis, nephrolithiasis etc.  . Serious etiologies were considered.   The initial plan is to screening labs ordered in triage, will obtain CT imaging, give IV fluids and analgesics   Additional history obtained: Additional history obtained from  na Records reviewed  prior ED visits, prior labs/imaging/culture/home meds  Independent labs interpretation:  The following labs were independently interpreted:  Mild hyponatremia 131, creatinine 0.65.  WBC 11.7, mildly  increased from prior.  Hemoglobin  9.0; approx 2 gram drop since 10 days ago at 10.9. Baseline around 10-13; will send diff and anemia panel Urinalysis with blood, rare bacteria.  She is still on antibiotics for pyelonephritis.  Independent visualization of imaging: - I independently visualized the following imaging with scope of interpretation limited to determining acute life threatening conditions related to emergency care: CTAP, which revealed concern for renal mass, poss neoplastic  Cardiac monitoring was reviewed and interpreted by myself which shows NSR  Treatment and Reassessment: Analgesics, IV fluids, Zofran >> Greatly improved  Consultation: - Consulted or discussed management/test interpretation w/ external professional: [facility]  Consideration for admission or further workup: Admission was considered   Patient with left sided flank pain, has since improved since the onset.  CT imaging concerning for possible neoplastic process to left kidney.  Patient also is anemic.  No ongoing bleeding reported.  D/w urology, recommend close outpatient follow-up in the office in the next 1 to 2 days.  Also given hematology follow-up in regards to her worsening anemia.  Advised her to continue taking her previously prescribed antibiotics  D/w pt imaging and laboratory findings at length with patient at bedside.  She is agreeable to plan with urology and with outpatient follow-up and management of her abnormal CT imaging.  She was instructed to call urology later on this morning for follow-up appointment.  The patient improved significantly and was discharged in stable condition. Detailed discussions were had with the patient regarding current findings, and need for close f/u with PCP or on call doctor. The patient has been instructed to return immediately if the symptoms worsen in any way for re-evaluation. Patient verbalized understanding and is in agreement with current care plan. All  questions answered prior to discharge.    Social Determinants of health: Social History   Tobacco Use   Smoking status: Never   Smokeless tobacco: Never  Vaping Use   Vaping Use: Never used  Substance Use Topics   Alcohol use: Yes    Comment: Socially    Drug use: No            Final Clinical Impression(s) / ED Diagnoses Final diagnoses:  Anemia, unspecified type  Left flank pain  Renal mass, left    Rx / DC Orders ED Discharge Orders          Ordered    oxyCODONE (ROXICODONE) 5 MG immediate release tablet  Every 4 hours PRN        10/17/22 0621              Jeanell Sparrow, DO 10/17/22 [phone]

## 2022-10-17 NOTE — Discharge Instructions (Addendum)
CT imaging is concerning for possibly a tumor on your left kidney.  Please call the urology office this morning to schedule follow-up.  Hemoglobin (red blood count) was also low today, this may be secondary to the possible tumor on your kidney.  Recommend that you also follow up with hematology regarding you low blood count/anemia.  Please continue taking your Omnicef that was prescribed during prior visit  Please return to the emergency department for any worsening or worrisome symptoms.

## 2022-10-18 ENCOUNTER — Telehealth: Payer: Self-pay | Admitting: Oncology

## 2022-10-18 ENCOUNTER — Inpatient Hospital Stay: Payer: No Typology Code available for payment source | Attending: Oncology | Admitting: Oncology

## 2022-10-18 ENCOUNTER — Encounter: Payer: Self-pay | Admitting: *Deleted

## 2022-10-18 ENCOUNTER — Ambulatory Visit (HOSPITAL_BASED_OUTPATIENT_CLINIC_OR_DEPARTMENT_OTHER): Admission: RE | Admit: 2022-10-18 | Payer: No Typology Code available for payment source | Source: Ambulatory Visit

## 2022-10-18 ENCOUNTER — Other Ambulatory Visit (HOSPITAL_BASED_OUTPATIENT_CLINIC_OR_DEPARTMENT_OTHER): Payer: Self-pay | Admitting: Physician Assistant

## 2022-10-18 VITALS — BP 112/61 | HR 65 | Temp 98.1°F | Resp 15 | Ht 64.0 in | Wt 121.1 lb

## 2022-10-18 DIAGNOSIS — K5190 Ulcerative colitis, unspecified, without complications: Secondary | ICD-10-CM | POA: Diagnosis not present

## 2022-10-18 DIAGNOSIS — F411 Generalized anxiety disorder: Secondary | ICD-10-CM | POA: Diagnosis not present

## 2022-10-18 DIAGNOSIS — N23 Unspecified renal colic: Secondary | ICD-10-CM | POA: Insufficient documentation

## 2022-10-18 DIAGNOSIS — R590 Localized enlarged lymph nodes: Secondary | ICD-10-CM | POA: Diagnosis not present

## 2022-10-18 DIAGNOSIS — D4102 Neoplasm of uncertain behavior of left kidney: Secondary | ICD-10-CM

## 2022-10-18 DIAGNOSIS — C642 Malignant neoplasm of left kidney, except renal pelvis: Secondary | ICD-10-CM

## 2022-10-18 DIAGNOSIS — Z79899 Other long term (current) drug therapy: Secondary | ICD-10-CM | POA: Insufficient documentation

## 2022-10-18 DIAGNOSIS — R319 Hematuria, unspecified: Secondary | ICD-10-CM | POA: Diagnosis not present

## 2022-10-18 DIAGNOSIS — R011 Cardiac murmur, unspecified: Secondary | ICD-10-CM | POA: Insufficient documentation

## 2022-10-18 DIAGNOSIS — R918 Other nonspecific abnormal finding of lung field: Secondary | ICD-10-CM

## 2022-10-18 LAB — URINE CULTURE: Culture: NO GROWTH

## 2022-10-18 MED ORDER — OXYCODONE HCL 5 MG PO TABS: 5.0000 mg | ORAL_TABLET | ORAL | 0 refills | Status: DC | PRN

## 2022-10-18 NOTE — Telephone Encounter (Signed)
Scheduled appt per 10/26 staff msg from Dr. Alen Blew. Pt is aware of appt date and time. Pt is aware to arrive 15 mins prior to appt time and to bring and updated insurance card. Pt is aware of appt location.

## 2022-10-18 NOTE — Progress Notes (Signed)
Reason for the request:    Kidney mass  HPI: I was asked by Dr. Birdie Riddle to evaluate Dr. Deborra Medina for the diagnosis of kidney mass.  Barbara Nicholson is a 46 year old woman with history of ulcerative colitis, uterine fibroids status post hysterectomy in April 2023.  Barbara Nicholson was seen in the emergency department on October 05, 2022 after presenting with flank pain and hematuria and was diagnosed with pyelonephritis after CT scan obtained at that time that showed a mass like a poor enhancement in the left kidney.  Barbara Nicholson was given Rocephin and the cefdinir orally.  Barbara Nicholson continues to have worsening left pain nausea vomiting and passing blood clots.  Barbara Nicholson is subsequently reevaluated emergency department October 24 and a CT scan at that time showed an infiltrative poorly enhancing masslike abnormality in the left upper pole of the kidney along with regional lymphadenopathy is noted.  Small but increasing intermediate left lower lobe posterior segment nodule was noted.  Since that time, Barbara Nicholson reports continuous dull nagging pain that currently manageable with oxycodone.  Barbara Nicholson denies any nausea, vomiting or worsening hematuria.  Barbara Nicholson does not report any headaches, blurry vision, syncope or seizures. Does not report any fevers, chills or sweats.  Does not report any cough, wheezing or hemoptysis.  Does not report any chest pain, palpitation, orthopnea or leg edema.  Does not report any nausea, vomiting or abdominal pain.  Does not report any constipation or diarrhea.  Does not report any skeletal complaints.    Does not report frequency, urgency or hematuria.  Does not report any skin rashes or lesions. Does not report any heat or cold intolerance.  Does not report any lymphadenopathy or petechiae.  Does not report any anxiety or depression.  Remaining review of systems is negative.     Past Medical History:  Diagnosis Date   AMA (advanced maternal age) multigravida 35+    Anemia    hx of anemia due to fibroids   B12 deficiency 2015    Concussion with < 1 hr loss of consciousness 09/10/2018   Patient fell at home and hit her head. HA, confusion, amnesia around the time of the accident.   Generalized anxiety disorder 2021   work-related, better as of 04/06/22   Heart murmur    as a baby   Hx of ulcerative colitis    Syncope 03/14/2014   syncope after epidsode of explosive diarrhea   Ulcerative colitis ([place])    hx of left sided colitis 05/23/18   Wears contact lenses    Wears glasses   :   Past Surgical History:  Procedure Laterality Date   ABDOMINAL HYSTERECTOMY     CESAREAN SECTION N/A 10/13/2013   Procedure: Primary Cesarean Section Delivery Baby  Girl  @ 0122, Apgars 9/9   ;  Surgeon: Cyril Mourning, MD;  Location: [facility];  Service: Obstetrics;  Laterality: N/A;   COLONOSCOPY  2020   HYSTERECTOMY ABDOMINAL WITH SALPINGECTOMY N/A 04/10/2022   Procedure: HYSTERECTOMY ABDOMINAL WITH BILATERAL SALPINGECTOMY;  Surgeon: Dian Queen, MD;  Location: [facility];  Service: Gynecology;  Laterality: N/A;   UTERINE SEPTUM RESECTION     around 2006   WISDOM TOOTH EXTRACTION  2002  :   Current Outpatient Medications:    ciprofloxacin (CIPRO) 500 MG tablet, Take 500 mg by mouth 2 (two) times daily., Disp: , Rfl:    HYDROcodone-acetaminophen (NORCO/VICODIN) 5-325 MG tablet, Take 1 tablet by mouth every 6 (six) hours as needed for severe pain., Disp: 6  tablet, Rfl: 0   ibuprofen (ADVIL) 600 MG tablet, Take 1 tablet (600 mg total) by mouth every 6 (six) hours., Disp: 30 tablet, Rfl: 0   Omega-3 Fatty Acids (OMEGA-3 CF PO), Take by mouth. With D3 for  dry eyes, Disp: , Rfl:    ondansetron (ZOFRAN-ODT) 4 MG disintegrating tablet, Take 1 tablet (4 mg total) by mouth every 8 (eight) hours as needed for nausea or vomiting., Disp: 20 tablet, Rfl: 0   oxyCODONE (OXY IR/ROXICODONE) 5 MG immediate release tablet, Take 1-2 tablets (5-10 mg total) by mouth every 4 (four) hours as needed for moderate pain., Disp: 30  tablet, Rfl: 0   oxyCODONE (ROXICODONE) 5 MG immediate release tablet, Take 1 tablet (5 mg total) by mouth every 4 (four) hours as needed for severe pain., Disp: 10 tablet, Rfl: 0   sulfamethoxazole-trimethoprim (BACTRIM DS) 800-160 MG tablet, Take 1 tablet by mouth 2 (two) times daily., Disp: , Rfl:    traZODone (DESYREL) 50 MG tablet, Take 0.5-1 tablets (25-50 mg total) by mouth at bedtime as needed for sleep., Disp: 30 tablet, Rfl: 3:  No Known Allergies:   Family History  Problem Relation Age of Onset   Mesothelioma Mother   :   Social History   Socioeconomic History   Marital status: Married    Spouse name: Not on file   Number of children: Not on file   Years of education: Not on file   Highest education level: Not on file  Occupational History   Not on file  Tobacco Use   Smoking status: Never   Smokeless tobacco: Never  Vaping Use   Vaping Use: Never used  Substance and Sexual Activity   Alcohol use: Yes    Comment: Socially    Drug use: No   Sexual activity: Yes    Comment: husband vasectomy  Other Topics Concern   Not on file  Social History Narrative   Not on file   Social Determinants of Health   Financial Resource Strain: Not on file  Food Insecurity: Not on file  Transportation Needs: Not on file  Physical Activity: Not on file  Stress: Not on file  Social Connections: Not on file  Intimate Partner Violence: Not on file  :  Pertinent items are noted in HPI.  Exam: Blood pressure 112/61, pulse 65, temperature 98.1 F (36.7 C), temperature source Temporal, resp. rate 15, height 5' 4"  (1.626 m), weight 121 lb 1.6 oz (54.9 kg), last menstrual period 03/16/2022, SpO2 100 %.  General appearance: alert and cooperative appeared in mild distress and slightly pale. Head: atraumatic without any abnormalities. Eyes: conjunctivae/corneas clear. PERRL.  Sclera anicteric. Throat: lips, mucosa, and tongue normal; without oral thrush or ulcers. Resp: clear to  auscultation bilaterally without rhonchi, wheezes or dullness to percussion. Cardio: regular rate and rhythm, S1, S2 normal, no murmur, click, rub or gallop GI: soft, non-tender; bowel sounds normal; no masses,  no organomegaly Skin: Skin color, texture, turgor normal. No rashes or lesions Lymph nodes: Cervical, supraclavicular, and axillary nodes normal. Neurologic: Grossly normal without any motor, sensory or deep tendon reflexes. Musculoskeletal: No joint deformity or effusion.   Recent Labs    10/16/22 2245 10/17/22 0635  WBC 11.7* 9.4  HGB 9.0* 9.1*  HCT 26.9* 28.3*  PLT 384 431*    Recent Labs    10/16/22 2245  NA 131*  K 3.8  CL 102  CO2 22  GLUCOSE 109*  BUN 12  CREATININE 0.65  CALCIUM 8.2*      CT ABDOMEN PELVIS W CONTRAST  Result Date: 10/17/2022 CLINICAL DATA:  46 year old female with abnormal left kidney recently, masslike and poorly enhancing. Gross hematuria. EXAM: CT ABDOMEN AND PELVIS WITH CONTRAST TECHNIQUE: Multidetector CT imaging of the abdomen and pelvis was performed using the standard protocol following bolus administration of intravenous contrast. RADIATION DOSE REDUCTION: This exam was performed according to the departmental dose-optimization program which includes automated exposure control, adjustment of the mA and/or kV according to patient size and/or use of iterative reconstruction technique. CONTRAST:  155m OMNIPAQUE IOHEXOL 300 MG/ML  SOLN COMPARISON:  CT Abdomen and Pelvis 10/05/2022. Follow-up renal ultrasound 10/12/2022. FINDINGS: Lower chest: Stable and well aerated lung bases. No pericardial or pleural effusion. However, there is a small but increasing left lower lobe posterior basal segment lung nodule which is now 3 mm on series 3, image 17 and was punctate earlier this month. But no other suspicious lung base nodule. Hepatobiliary: Liver is stable and within normal limits. Gallbladder is negative except for questionable cholelithiasis now  on series 2, image 35, 4 mm and dependent. No gallbladder inflammation. Pancreas: Negative. Spleen: Negative. Adrenals/Urinary Tract: Adrenal glands remain within normal limits. Right kidney remains within normal limits. Upper pole left renal expansion with infiltrative masslike hypoenhancement in an area of 47 by 59 x 77 mm (AP by transverse by CC) has not improved and appears larger since 10/05/2022 (43 by 49 x 70 mm at that time). Furthermore, there is a paucity of pararenal space inflammation, but there are abnormal small vessels such as on coronal image 81. Similar appearance of the left renal collecting system, with some urothelial thickening but no convincing asymmetric hydroureter or hydronephrosis. Left renal level retroperitoneal lymphadenopathy persists (series 2, image 25 and 32), appears partially cystic or necrotic and mildly progressed. Unremarkable bladder.  Incidental pelvic phleboliths. Stomach/Bowel: Increased retained stool throughout redundant large bowel from earlier this month. No discrete large bowel inflammation identified. No dilated small bowel. Decompressed stomach and duodenum. No free air or free fluid. Vascular/Lymphatic: Major arterial structures in the abdomen and pelvis are patent and normal. Portal venous system is patent. Left retroperitoneal lymphadenopathy is described with the kidney above. No other lymphadenopathy. Reproductive: Absent uterus. 2.5 cm simple fluid density right ovarian, No follow-up imaging is recommended. Reference: JACR 2020 Feb;17(2):248-254. Left ovary is within normal limits. Other: No pelvic free fluid. Musculoskeletal: Stable visualized osseous structures. No acute or suspicious osseous lesion identified. IMPRESSION: 1. Enlargement of the infiltrative, poorly enhancing and mass-like abnormality of the left renal upper pole since 10/05/2022, along with progressed regional lymphadenopathy, and a paucity of pararenal space inflammation (but some evidence of  abnormal neovascularity). The constellation is now more suspicious for Renal Neoplasm (including Renal Cell Carcinoma, Renal Lymphoma) than lobar nephronia or pyelonephritis. 2. Small but increasing and indeterminate left lower lobe posterior basal segment lung nodule, 3 mm. Attention on follow-up. 3. No other metastatic disease identified in the abdomen or pelvis. Increased retained stool throughout redundant large bowel from earlier this month. Electronically Signed   By: HGenevie AnnM.D.   On: 10/17/2022 04:30   [place]NAL  Result Date: 10/12/2022 CLINICAL DATA:  Left flank pain. Pyelonephritis. Evaluate for renal abscess. EXAM: RENAL / URINARY TRACT ULTRASOUND COMPLETE COMPARISON:  Ultrasound on 10/07/2022, and CT on 10/05/2022 FINDINGS: Right Kidney: Renal measurements: 11.1 x 4.4 x 4.3 cm = volume: 111 mL. Echogenicity within normal limits. No mass or hydronephrosis visualized. Left Kidney: Renal measurements:  11.1 x 6.1 x 4.5 cm = volume: 160 mL. Echogenicity within normal limits. A focal area of soft tissue prominence is again seen in the upper pole of the left kidney measuring approximately 5.4 x 3.9 cm. This shows decreased blood flow compared to remainder of the renal parenchyma on color Doppler ultrasound. This shows no evidence of liquefaction. This also similar in appearance to prior CT on 10/05/2022. No evidence of hydronephrosis. Bladder: Appears normal for degree of bladder distention. Other: None. IMPRESSION: 5 cm solid-appearing mass in upper pole of left kidney, similar in appearance to recent CT. This is consistent with focal pyelonephritis in the appropriate clinical setting, and there is no evidence of liquified abscess. Recommend continued follow-up to confirm resolution and exclude renal mass. No evidence of hydronephrosis. Electronically Signed   By: Marlaine Hind M.D.   On: 10/12/2022 19:00   US Renal  Result Date: 10/07/2022 CLINICAL DATA:  Left flank pain.  Follow-up abnormal CT scan.  EXAM: RENAL / URINARY TRACT ULTRASOUND COMPLETE COMPARISON:  CT, 10/05/2022. FINDINGS: Right Kidney: Renal measurements: 11.2 x 4.2 x 3.9 cm = volume: 97 mL. Echogenicity within normal limits. No mass or hydronephrosis visualized. Left Kidney: Renal measurements: 11.3 x 5.9 x 4.7 cm = volume: 163 mL. No defined mass. Area of relative hyperechogenicity and the minutes vascularity in the mid to upper pole of the left kidney corresponds to the area of hypoattenuation on CT. This is suspicious for pyelonephritis. No stones. No hydronephrosis. Bladder: Appears normal for degree of bladder distention. Other: None. IMPRESSION: 1. Sonographic findings support focal pyelonephritis of the left kidney as the cause of the masslike area noted on the recent prior CT. However, if this is not consistent clinically, recommend follow-up renal MRI without and with contrast for further assessment. Electronically Signed   By: Lajean Manes M.D.   On: 10/07/2022 14:05   CT ABDOMEN PELVIS W CONTRAST  Result Date: 10/05/2022 CLINICAL DATA:  Left-sided pain for 2 days EXAM: CT ABDOMEN AND PELVIS WITH CONTRAST TECHNIQUE: Multidetector CT imaging of the abdomen and pelvis was performed using the standard protocol following bolus administration of intravenous contrast. RADIATION DOSE REDUCTION: This exam was performed according to the departmental dose-optimization program which includes automated exposure control, adjustment of the mA and/or kV according to patient size and/or use of iterative reconstruction technique. CONTRAST:  140m OMNIPAQUE IOHEXOL 300 MG/ML  SOLN COMPARISON:  None Available. FINDINGS: Lower chest: No acute abnormality. Hepatobiliary: No focal liver abnormality is seen. No gallstones, gallbladder wall thickening, or biliary dilatation. Pancreas: Unremarkable. No pancreatic ductal dilatation or surrounding inflammatory changes. Spleen: Normal in size without focal abnormality. Adrenals/Urinary Tract: The adrenal  glands are unremarkable. In the superolateral left kidney, there is a masslike area of poor enhancement that appears edematous (series 4, image 96). The right kidney is unremarkable. No nephrolithiasis or hydronephrosis. The bladder is unremarkable for degree of distension. Stomach/Bowel: Stomach is within normal limits. Appendix is not visualized. No evidence of bowel wall thickening, distention, or inflammatory changes. Vascular/Lymphatic: No significant vascular findings are present. Suppurative lymph node between the aorta and left kidney (series 2, image 24), which measures up to 1.8 cm (series 4, image 84). Additional prominent left periaortic lymph nodes that are not of abnormal density. No enlarged pelvic lymph nodes. Reproductive: The uterus is unremarkable. Small cysts in the bilateral ovaries, measuring up to 1.9 cm on the right and 1.0 cm on the left. No follow-up imaging recommended. Reference: JACR 2020 Feb; 17(2):248-254 Other:  No free fluid or free air in the abdomen or pelvis. Musculoskeletal: No acute or significant osseous findings. IMPRESSION: 1. Masslike area of poor enhancement in the superolateral left kidney, which appears edematous, concerning for pyelonephritis. Recommend correlation with urinalysis and consider follow-up imaging to confirm resolution. 2. Suppurative lymph node between the aorta and left kidney, which measures up to 1.8 cm. Additional prominent left periaortic lymph nodes that are not of abnormal density, likely reactive. Recommend attention on follow-up. Electronically Signed   By: Merilyn Baba M.D.   On: 10/05/2022 14:23    Assessment and Plan:    46 year old with:  1.  Left renal mass suspicious for primary kidney neoplasm noted on imaging studies in October 2023.  Barbara Nicholson presented with a mass measuring 47 x 59 x 77 mm.  This was associated with left renal level retroperitoneal lymphadenopathy.  The differential diagnosis of these findings were discussed at this  time.  Primary kidney neoplasm remains the most likely etiology with pyelonephritis appears to be less likely given the lack of response to antibiotics and possible growth is noted in the last few weeks.  Barbara Nicholson is scheduled to see Dr. Lovena Neighbours in the near future.  Management options at this time were discussed.  Primary surgical therapy would be preferable at this time given the limited volume of disease outside of her kidney mass and the symptomatology that is associated with this tumor.  Hematuria and pain are indication for primary nephrectomy even if Barbara Nicholson has metastatic disease.  Obtaining a PET scan would be important to determine the extent of the disease beyond the kidney.  If Barbara Nicholson has high-volume disease outside of the kidney and option of neoadjuvant systemic therapy could be considered after obtaining tissue biopsy.  If Barbara Nicholson has clear-cell histology, preoperative treatment with ipilimumab and nivolumab could be considered.  A disease response at this time would be favorable before proceeding with a radical nephrectomy.  Both of these approaches were debated at this time.  I have recommended proceeding with a PET scan and MRI of the abdomen before proceeding with surgery.  2.  Anemia: Barbara Nicholson has element of iron deficiency with iron level 26 and saturation of 9%.  This is related to hematuria and possibly chronic disease given her malignancy.  3. Follow up: will be determine pending imaging studies.   60  minutes were dedicated to this visit. The time was spent on reviewing laboratory data, imaging studies, discussing treatment options, discussing differential diagnosis and answering questions regarding future plan.     A copy of this consult has been forwarded to the requesting physician.

## 2022-10-18 NOTE — Progress Notes (Signed)
Received a referral for this patient to be seen for possible renal malignancy. Review of chart shows that patient is already scheduled with Dr Alen Blew at our [place] location. Will close referral.

## 2022-10-22 ENCOUNTER — Ambulatory Visit (HOSPITAL_COMMUNITY)
Admission: RE | Admit: 2022-10-22 | Discharge: 2022-10-22 | Disposition: A | Discharge status: Home / Self Care | Payer: No Typology Code available for payment source | Source: Ambulatory Visit | Attending: Oncology | Admitting: Oncology

## 2022-10-22 ENCOUNTER — Telehealth: Payer: Self-pay | Admitting: *Deleted

## 2022-10-22 DIAGNOSIS — C642 Malignant neoplasm of left kidney, except renal pelvis: Secondary | ICD-10-CM | POA: Insufficient documentation

## 2022-10-22 MED ORDER — GADOBUTROL 1 MMOL/ML IV SOLN
5.5000 mL | Freq: Once | INTRAVENOUS | Status: AC | PRN
  Administered 2022-10-22: 5.5 mL via INTRAVENOUS

## 2022-10-22 NOTE — Telephone Encounter (Signed)
Patient called to report that she had MRI of her abdomen completed and wanted Dr Alen Blew to see the results.  Results are not available as of yet.  She reports pain the abdomen is a 10 out 10 even with the Oxycodone 5 and 10 mg.  She reports nausea and extreme lethargy with this medication.  She was told to not take any NSAIDS at this time incase they have to biopsy the area.    Instructed that if pain is too severe then she should go to emergency room for pain mgmt and to continue to take her Oxycodone.  Advised that if she can wait until tomorrow then our [facility] clinic may be able to see her in house for pain control or adjustments to those medications.  Dr Alen Blew returns to clinic on Wednesday 10/24/2022.    Patient states that she will follow the plan and handle according to her pain level.

## 2022-10-23 ENCOUNTER — Telehealth: Payer: Self-pay | Admitting: *Deleted

## 2022-10-23 NOTE — Telephone Encounter (Signed)
Contacted patient to follow up on conversation with nurse yesterday regarding managing pain. Patient reports abdominal pain was still bad last night, 8-10 with medication. She reports it is better/less today with continuing to take pain med as prescribed. She states only pain as current symptom.   Offered patient appt today with [facility] for evaluation. She declined at this time, saying she prefers to wait until scan results are ready and she can discuss with MD. Patient advised Dr Alen Blew returns to clinic on Wednesday 10/24/2022.   Encouraged her to go to ED if needed if pain is not manageable and/or to contact this office for appt with [facility]. She verbalized understanding of all information,

## 2022-10-24 ENCOUNTER — Telehealth: Payer: Self-pay | Admitting: *Deleted

## 2022-10-24 ENCOUNTER — Other Ambulatory Visit: Payer: Self-pay | Admitting: Urology

## 2022-10-24 ENCOUNTER — Other Ambulatory Visit: Payer: Self-pay | Admitting: Oncology

## 2022-10-24 DIAGNOSIS — C689 Malignant neoplasm of urinary organ, unspecified: Secondary | ICD-10-CM

## 2022-10-24 DIAGNOSIS — C652 Malignant neoplasm of left renal pelvis: Secondary | ICD-10-CM

## 2022-10-24 DIAGNOSIS — C7801 Secondary malignant neoplasm of right lung: Secondary | ICD-10-CM

## 2022-10-24 DIAGNOSIS — C772 Secondary and unspecified malignant neoplasm of intra-abdominal lymph nodes: Secondary | ICD-10-CM

## 2022-10-24 HISTORY — DX: Malignant neoplasm of left renal pelvis: C65.2

## 2022-10-24 HISTORY — DX: Secondary and unspecified malignant neoplasm of intra-abdominal lymph nodes: C77.2

## 2022-10-24 HISTORY — PX: PR CYSTO W/URTRL CATHJ BRUSH BX URTR&/RENAL PELVIS: 52007

## 2022-10-24 HISTORY — DX: Secondary malignant neoplasm of right lung: C78.01

## 2022-10-24 HISTORY — DX: Malignant neoplasm of urinary organ, unspecified: C68.9

## 2022-10-24 MED ORDER — HYDROMORPHONE HCL 4 MG PO TABS: ORAL_TABLET | ORAL | 0 refills | Status: DC

## 2022-10-24 NOTE — Telephone Encounter (Signed)
PC to patient, informed her Dr Alen Blew has sent in rx for dilaudid, 1-2 tablets every four hours as needed for pain.  Instructed patient to call this office if this is not managing her pain, she verbalizes understanding.

## 2022-10-24 NOTE — Telephone Encounter (Signed)
-----   Message from Wyatt Portela, MD sent at 10/24/2022  1:53 PM EDT ----- I will send an RX for Dilaudid. Thanks ----- Message ----- From: Rolene Course, RN Sent: 10/24/2022   1:46 PM EDT To: Wyatt Portela, MD  This patient called, she has a nephrectomy scheduled on 11/10, however, her pain has greatly increased & she is taking two oxycodone at a time & needs a refill.  Wanted to let you know in case you want to prescribe a stronger dose.

## 2022-10-25 ENCOUNTER — Inpatient Hospital Stay: Payer: No Typology Code available for payment source | Attending: Oncology

## 2022-10-25 ENCOUNTER — Encounter (HOSPITAL_COMMUNITY)
Admission: RE | Admit: 2022-10-25 | Discharge: 2022-10-25 | Disposition: A | Discharge status: Home / Self Care | Payer: No Typology Code available for payment source | Source: Ambulatory Visit | Attending: Oncology | Admitting: Oncology

## 2022-10-25 DIAGNOSIS — C642 Malignant neoplasm of left kidney, except renal pelvis: Secondary | ICD-10-CM | POA: Diagnosis present

## 2022-10-25 DIAGNOSIS — R918 Other nonspecific abnormal finding of lung field: Secondary | ICD-10-CM | POA: Diagnosis present

## 2022-10-25 LAB — GLUCOSE, CAPILLARY: Glucose-Capillary: 86 mg/dL (ref 70–99)

## 2022-10-25 MED ORDER — FLUDEOXYGLUCOSE F - 18 (FDG) INJECTION
5.6000 | Freq: Once | INTRAVENOUS | Status: AC
  Administered 2022-10-25: 5.85 via INTRAVENOUS

## 2022-10-25 NOTE — Progress Notes (Signed)
[place] Work  Initial Assessment   Barbara Nicholson is a 46 y.o. year old female contacted by phone. Clinical Social Work was referred by new patient protocol for assessment of psychosocial needs.   SDOH (Social Determinants of Health) assessments performed: Yes SDOH Interventions    Flowsheet Row Clinical Support from 10/25/2022 in [facility]  SDOH Interventions   Food Insecurity Interventions Intervention Not Indicated  Housing Interventions Intervention Not Indicated  Transportation Interventions Intervention Not Indicated  Utilities Interventions Intervention Not Indicated  Financial Strain Interventions Intervention Not Indicated       SDOH Screenings   Food Insecurity: No Food Insecurity (10/25/2022)  Housing: Low Risk  (10/25/2022)  Transportation Needs: No Transportation Needs (10/25/2022)  Utilities: Not At Risk (10/25/2022)  Financial Resource Strain: Low Risk  (10/25/2022)  Tobacco Use: Low Risk  (10/17/2022)     Distress Screen completed: No     No data to display            Family/Social Information:  Housing Arrangement: patient lives with her spouse and two children, ages 45 and 66. Family members/support persons in your life? Family and Friends Transportation concerns: no  Employment: Not discussed Income source:  Financial concerns: No Type of concern: None Food access concerns: no Religious or spiritual practice: Not known Services Currently in place:  [employer]  Coping/ Adjustment to diagnosis: Patient understands treatment plan and what happens next? Yes.  She is waiting for test results. Concerns about diagnosis and/or treatment: Overwhelmed by information.  Patient reports her pain is better today. Patient reported stressors: Adjusting to my illness Hopes and/or priorities: Patient expressed concern for her children while she receives treatment. Patient enjoys time with family/ friends Current coping skills/  strengths: Average or above average intelligence , Capable of independent living , Communication skills , Financial means , General fund of knowledge , Motivation for treatment/growth , and Supportive family/friends     SUMMARY: Current SDOH Barriers:  None  Clinical Social Work Clinical Goal(s):  Patient will work with SW to address concerns related to adjusting to her illness.  Interventions: Discussed common feeling and emotions when being diagnosed with cancer, and the importance of support during treatment Informed patient of the support team roles and support services at [facility] Provided CSW contact information and encouraged patient to call with any questions or concerns Provided patient with information about the [employer] and [employer] and [employer].  Mailed patient information on talking to children about cancer.   Follow Up Plan: Patient will contact CSW with any support or resource needs Patient verbalizes understanding of plan: Yes    Rodman Pickle Kalep Full, LCSW

## 2022-10-26 ENCOUNTER — Telehealth: Payer: Self-pay | Admitting: *Deleted

## 2022-10-26 ENCOUNTER — Inpatient Hospital Stay (HOSPITAL_BASED_OUTPATIENT_CLINIC_OR_DEPARTMENT_OTHER): Payer: No Typology Code available for payment source | Admitting: Oncology

## 2022-10-26 DIAGNOSIS — C642 Malignant neoplasm of left kidney, except renal pelvis: Secondary | ICD-10-CM | POA: Diagnosis not present

## 2022-10-26 NOTE — Progress Notes (Signed)
Hematology and Oncology Follow Up for Telemedicine Visits  Barbara Nicholson [id number] 1976-09-19 46 y.o. 10/26/2022 2:51 PM Barbara Nicholson, MDRichter, [name], MD   I connected with Barbara Nicholson on 10/26/22 at  2:00 PM EDT by telephone visit and verified that I am speaking with the correct person using two identifiers.   I discussed the limitations, risks, security and privacy concerns of performing an evaluation and management service by telemedicine and the availability of in-person appointments. I also discussed with the patient that there may be a patient responsible charge related to this service. The patient expressed understanding and agreed to proceed.  Other persons participating in the visit and their role in the encounter: Friend  Patient's location: Home Provider's location: Office    Principle Diagnosis: 46 year old woman with left renal neoplasm diagnosed in October 2023.  She presented with mass measuring 4.7 x 5.9 x 7.7.  PET scan showed concerning evidence of metastatic disease in the lung and retroperitoneal lymph nodes.    Current therapy: Under evaluation for nephrectomy.  Interim History: Barbara Nicholson reports no major complaints at this time.  Her left flank pain has been reasonably controlled with NSAIDs and uses Dilaudid as needed.  She denies any hematuria or dysuria.  She denies any other complaints.     Medications: I have reviewed the patient's current medications.  Current Outpatient Medications  Medication Sig Dispense Refill   HYDROcodone-acetaminophen (NORCO/VICODIN) 5-325 MG tablet Take 1 tablet by mouth every 6 (six) hours as needed for severe pain. (Patient not taking: Reported on 10/25/2022) 6 tablet 0   HYDROmorphone (DILAUDID) 4 MG tablet Take one to two every 4 hours as needed. 60 tablet 0   ibuprofen (ADVIL) 600 MG tablet Take 1 tablet (600 mg total) by mouth every 6 (six) hours. 30 tablet 0   ondansetron (ZOFRAN-ODT) 4 MG disintegrating tablet Take 1  tablet (4 mg total) by mouth every 8 (eight) hours as needed for nausea or vomiting. 20 tablet 0   oxyCODONE (OXY IR/ROXICODONE) 5 MG immediate release tablet Take 1-2 tablets (5-10 mg total) by mouth every 4 (four) hours as needed for moderate pain. 30 tablet 0   oxyCODONE (ROXICODONE) 5 MG immediate release tablet Take 1 tablet (5 mg total) by mouth every 4 (four) hours as needed for severe pain. (Patient not taking: Reported on 10/25/2022) 10 tablet 0   sulfamethoxazole-trimethoprim (BACTRIM DS) 800-160 MG tablet Take 1 tablet by mouth 2 (two) times daily.     traZODone (DESYREL) 50 MG tablet Take 0.5-1 tablets (25-50 mg total) by mouth at bedtime as needed for sleep. 30 tablet 3   No current facility-administered medications for this visit.     Allergies: No Known Allergies  Past Medical History, Surgical history, Social history, and Family History were reviewed and updated.     Lab Results: Lab Results  Component Value Date   WBC 9.4 10/17/2022   HGB 9.1 (L) 10/17/2022   HCT 28.3 (L) 10/17/2022   MCV 86.0 10/17/2022   PLT 431 (H) 10/17/2022     Chemistry      Component Value Date/Time   NA 131 (L) 10/16/2022 2245   K 3.8 10/16/2022 2245   CL 102 10/16/2022 2245   CO2 22 10/16/2022 2245   BUN 12 10/16/2022 2245   CREATININE 0.65 10/16/2022 2245      Component Value Date/Time   CALCIUM 8.2 (L) 10/16/2022 2245   ALKPHOS 57 10/16/2022 2245   AST 12 (L) 10/16/2022  2245   ALT 22 10/16/2022 2245   BILITOT 0.5 10/16/2022 2245       Radiological Studies: NM PET Image Initial (PI) Skull Base To Thigh  Result Date: 10/26/2022 CLINICAL DATA:  Initial treatment strategy for left renal mass and pulmonary nodules. EXAM: NUCLEAR MEDICINE PET SKULL BASE TO THIGH TECHNIQUE: 5.9 mCi F-18 FDG was injected intravenously. Full-ring PET imaging was performed from the skull base to thigh after the radiotracer. CT data was obtained and used for attenuation correction and anatomic  localization. Fasting blood glucose: 86 mg/dl COMPARISON:  Abdominal MRI of 10/22/2022. Abdominopelvic CT 10/17/2022. No prior chest CTs. FINDINGS: Mediastinal blood pool activity: SUV max 1.9 Liver activity: SUV max NA NECK: No areas of abnormal hypermetabolism. Incidental CT findings: No cervical adenopathy. Minimal fluid in the sphenoid sinus. CHEST: Small bilateral pulmonary nodules with hypermetabolism, most consistent with metastatic disease. Example medial right lower lobe at 7 mm and a S.U.V. max of 3.4 on 68/4. Within the posterior left upper lobe at 8 mm and a S.U.V. max of 1.5 on 65/4. Hypermetabolism within the central right lower lobe/infrahilar region could represent a diminutive nodule or infrahilar node. Example at a S.U.V. max of 2.7 on 69/4 Incidental CT findings: None. ABDOMEN/PELVIS: The known upper pole left renal primary is significantly hypermetabolic, including at a S.U.V. max of 19.0 on 112/4. Multiple hypermetabolic left periaortic nodal metastasis, including an index node of 1.6 x 2.2 cm and a S.U.V. max of 15.1 on 119/4. Mild hypermetabolism in the porta hepatis is favored to be nodal (no correlate liver lesion on recent MRI). No adenopathy in this area. Example a S.U.V. max of 3.5 on 109/4 Incidental CT findings: Deferred to multiple prior abdominopelvic CTs, without acute superimposed process. Large colonic stool burden. SKELETON: No abnormal marrow activity. Incidental CT findings: None. IMPRESSION: 1. Left renal primary with abdominal retroperitoneal nodal and pulmonary metastasis. 2. Equivocal right infrahilar and porta hepatis nodal metastasis. Electronically Signed   By: Abigail Miyamoto M.D.   On: 10/26/2022 10:10     Impression and Plan:  46 year old with:   1.  Kidney mass suspicious for primary kidney neoplasm noted in October 2023.  She was found to have left renal mass measuring 47 x 59 x 77 mm.  PET scan obtained on October 25, 2022 showed evidence of pulmonary nodules as  well as retroperitoneal metastasis.   The natural course of this disease treatment options were reviewed at this time.  PET scan results was also discussed today in detail.  I still believe upfront nephrectomy is the way to proceed given the size of the tumor and the symptoms associated with it.  Upon completing the nephrectomy we will have a better idea of the pathology as well as molecular testing could help with that as well.  Systemic treatment would likely be recommended at this time pending the results.  If we are dealing with clear-cell renal cell carcinoma combination immunotherapy versus immunotherapy with oral targeted therapy would be considered.  I favor combination immunotherapy especially if she has sarcomatoid features and the possibility of a long-term disease control and possible complete response.  She is agreeable to proceed with this plan and will set up a follow-up after her surgery to discuss the final pathology.  She is also establishing medical oncology care at [facility] which I recommended at this time.   .   3. Follow up: In the next 2 to 3 weeks.        I  provided 20 minutes of non face-to-face telephone visit time during this encounter.  The time was spent discussing the imaging studies, differential diagnosis and management options for the future.   Zola Button, MD 10/26/2022 2:51 PM

## 2022-10-26 NOTE — Telephone Encounter (Signed)
PC to patient, informed her Dr Alen Blew would like a phone visit with her today at 2:00 to discuss PET results,  patient states she is available.  High priority scheduling message sent.

## 2022-10-29 ENCOUNTER — Other Ambulatory Visit (HOSPITAL_COMMUNITY): Payer: No Typology Code available for payment source

## 2022-10-30 NOTE — Patient Instructions (Signed)
SURGICAL WAITING ROOM VISITATION Patients having surgery or a procedure may have no more than 2 support people in the waiting area - these visitors may rotate.   Children under the age of 3 must have an adult with them who is not the patient. If the patient needs to stay at the hospital during part of their recovery, the visitor guidelines for inpatient rooms apply. Pre-op nurse will coordinate an appropriate time for 1 support person to accompany patient in pre-op.  This support person may not rotate.    Please refer to the [facility] website for the visitor guidelines for Inpatients (after your surgery is over and you are in a regular room).    Your procedure is scheduled on: 11/02/22   Report to [facility] Main Entrance    Report to admitting at 12:30 PM   Call this number if you have problems the morning of surgery [phone]   Follow a clear liquid diet the day before surgery.          If you have questions, please contact your surgeon's office.   FOLLOW BOWEL PREP AND ANY ADDITIONAL PRE OP INSTRUCTIONS YOU RECEIVED FROM YOUR SURGEON'S OFFICE!!!     Oral Hygiene is also important to reduce your risk of infection.                                    Remember - BRUSH YOUR TEETH THE MORNING OF SURGERY WITH YOUR REGULAR TOOTHPASTE   Do NOT smoke after Midnight   Take these medicines the morning of surgery with A SIP OF WATER: Dilaudid, Zofran, Bactrim  DO NOT TAKE ANY ORAL DIABETIC MEDICATIONS DAY OF YOUR SURGERY  Bring CPAP mask and tubing day of surgery.                              You may not have any metal on your body including hair pins, jewelry, and body piercing             Do not wear make-up, lotions, powders, perfumes, or deodorant  Do not wear nail polish including gel and S&S, artificial/acrylic nails, or any other type of covering on natural nails including finger and toenails. If you have artificial nails, gel coating, etc. that needs to be removed  by a nail salon please have this removed prior to surgery or surgery may need to be canceled/ delayed if the surgeon/ anesthesia feels like they are unable to be safely monitored.   Do not shave  48 hours prior to surgery.    Do not bring valuables to the hospital. [place].   Contacts, dentures or bridgework may not be worn into surgery.   Bring small overnight bag day of surgery.   DO NOT [place]. PHARMACY WILL DISPENSE MEDICATIONS LISTED ON YOUR MEDICATION LIST TO YOU DURING YOUR ADMISSION [place]!   Special Instructions: Bring a copy of your healthcare power of attorney and living will documents the day of surgery if you haven't scanned them before.              Please read over the following fact sheets you were given: IF YOU HAVE QUESTIONS ABOUT YOUR PRE-OP INSTRUCTIONS PLEASE CALL  [name]    If you received a COVID test during your pre-op visit  it is requested that you wear a mask when out in public, stay away from anyone that may not be feeling well and notify your surgeon if you develop symptoms. If you test positive for Covid or have been in contact with anyone that has tested positive in the last 10 days please notify you surgeon.     - Preparing for Surgery Before surgery, you can play an important role.  Because skin is not sterile, your skin needs to be as free of germs as possible.  You can reduce the number of germs on your skin by washing with CHG (chlorahexidine gluconate) soap before surgery.  CHG is an antiseptic cleaner which kills germs and bonds with the skin to continue killing germs even after washing. Please DO NOT use if you have an allergy to CHG or antibacterial soaps.  If your skin becomes reddened/irritated stop using the CHG and inform your nurse when you arrive at Short Stay. Do not shave (including legs and underarms) for at least 48 hours  prior to the first CHG shower.  You may shave your face/neck.  Please follow these instructions carefully:  1.  Shower with CHG Soap the night before surgery and the  morning of surgery.  2.  If you choose to wash your hair, wash your hair first as usual with your normal  shampoo.  3.  After you shampoo, rinse your hair and body thoroughly to remove the shampoo.                             4.  Use CHG as you would any other liquid soap.  You can apply chg directly to the skin and wash.  Gently with a scrungie or clean washcloth.  5.  Apply the CHG Soap to your body ONLY FROM THE NECK DOWN.   Do   not use on face/ open                           Wound or open sores. Avoid contact with eyes, ears mouth and   genitals (private parts).                       Wash face,  Genitals (private parts) with your normal soap.             6.  Wash thoroughly, paying special attention to the area where your    surgery  will be performed.  7.  Thoroughly rinse your body with warm water from the neck down.  8.  DO NOT shower/wash with your normal soap after using and rinsing off the CHG Soap.                9.  Pat yourself dry with a clean towel.            10.  Wear clean pajamas.            11.  Place clean sheets on your bed the night of your first shower and do not  sleep with pets. Day of Surgery : Do not apply any lotions/deodorants the morning of surgery.  Please wear clean clothes to the hospital/surgery center.  FAILURE TO FOLLOW THESE INSTRUCTIONS MAY RESULT IN THE CANCELLATION OF YOUR SURGERY  PATIENT SIGNATURE_________________________________  NURSE  SIGNATURE__________________________________  ________________________________________________________________________ WHAT IS A BLOOD TRANSFUSION? Blood Transfusion Information  A transfusion is the replacement of blood or some of its parts. Blood is made up of multiple cells which provide different functions. Red blood cells carry oxygen and are  used for blood loss replacement. White blood cells fight against infection. Platelets control bleeding. Plasma helps clot blood. Other blood products are available for specialized needs, such as hemophilia or other clotting disorders. BEFORE THE TRANSFUSION  Who gives blood for transfusions?  Healthy volunteers who are fully evaluated to make sure their blood is safe. This is blood bank blood. Transfusion therapy is the safest it has ever been in the practice of medicine. Before blood is taken from a donor, a complete history is taken to make sure that person has no history of diseases nor engages in risky social behavior (examples are intravenous drug use or sexual activity with multiple partners). The donor's travel history is screened to minimize risk of transmitting infections, such as malaria. The donated blood is tested for signs of infectious diseases, such as HIV and hepatitis. The blood is then tested to be sure it is compatible with you in order to minimize the chance of a transfusion reaction. If you or a relative donates blood, this is often done in anticipation of surgery and is not appropriate for emergency situations. It takes many days to process the donated blood. RISKS AND COMPLICATIONS Although transfusion therapy is very safe and saves many lives, the main dangers of transfusion include:  Getting an infectious disease. Developing a transfusion reaction. This is an allergic reaction to something in the blood you were given. Every precaution is taken to prevent this. The decision to have a blood transfusion has been considered carefully by your caregiver before blood is given. Blood is not given unless the benefits outweigh the risks. AFTER THE TRANSFUSION Right after receiving a blood transfusion, you will usually feel much better and more energetic. This is especially true if your red blood cells have gotten low (anemic). The transfusion raises the level of the red blood cells  which carry oxygen, and this usually causes an energy increase. The nurse administering the transfusion will monitor you carefully for complications. HOME CARE INSTRUCTIONS  No special instructions are needed after a transfusion. You may find your energy is better. Speak with your caregiver about any limitations on activity for underlying diseases you may have. SEEK MEDICAL CARE IF:  Your condition is not improving after your transfusion. You develop redness or irritation at the intravenous (IV) site. SEEK IMMEDIATE MEDICAL CARE IF:  Any of the following symptoms occur over the next 12 hours: Shaking chills. You have a temperature by mouth above 102 F (38.9 C), not controlled by medicine. Chest, back, or muscle pain. People around you feel you are not acting correctly or are confused. Shortness of breath or difficulty breathing. Dizziness and fainting. You get a rash or develop hives. You have a decrease in urine output. Your urine turns a dark color or changes to pink, red, or brown. Any of the following symptoms occur over the next 10 days: You have a temperature by mouth above 102 F (38.9 C), not controlled by medicine. Shortness of breath. Weakness after normal activity. The white part of the eye turns yellow (jaundice). You have a decrease in the amount of urine or are urinating less often. Your urine turns a dark color or changes to pink, red, or brown. Document Released: 12/07/2000 Document Revised: 03/03/2012 Document  Reviewed: 07/26/2008 ExitCare Patient Information 2014 [place], [place].  _______________________________________________________________________

## 2022-10-30 NOTE — Progress Notes (Signed)
COVID Vaccine Completed:  Date of COVID positive in last 90 days:  PCP - Ival Bible, MD Cardiologist -   Chest x-ray -  EKG -  Stress Test -  ECHO -  Cardiac Cath -  Pacemaker/ICD device last checked: Spinal Cord Stimulator: PET 10/25/22 Epic  Bowel Prep -   Sleep Study -  CPAP -   Fasting Blood Sugar -  Checks Blood Sugar _____ times a day  Last dose of GLP1 agonist-  N/A GLP1 instructions:  N/A   Last dose of SGLT-2 inhibitors-  N/A SGLT-2 instructions: N/A   Blood Thinner Instructions: Aspirin Instructions: Last Dose:  Activity level:  Can go up a flight of stairs and perform activities of daily living without stopping and without symptoms of chest pain or shortness of breath.  Able to exercise without symptoms  Unable to go up a flight of stairs without symptoms of     Anesthesia review:   Patient denies shortness of breath, fever, cough and chest pain at PAT appointment  Patient verbalized understanding of instructions that were given to them at the PAT appointment. Patient was also instructed that they will need to review over the PAT instructions again at home before surgery.

## 2022-10-31 ENCOUNTER — Encounter (HOSPITAL_COMMUNITY): Payer: Self-pay

## 2022-10-31 ENCOUNTER — Encounter (HOSPITAL_COMMUNITY)
Admission: RE | Admit: 2022-10-31 | Discharge: 2022-10-31 | Disposition: A | Discharge status: Home / Self Care | Payer: No Typology Code available for payment source | Source: Ambulatory Visit | Attending: Anesthesiology | Admitting: Anesthesiology

## 2022-10-31 DIAGNOSIS — G893 Neoplasm related pain (acute) (chronic): Secondary | ICD-10-CM | POA: Insufficient documentation

## 2022-11-02 ENCOUNTER — Encounter (HOSPITAL_COMMUNITY): Admission: RE | Payer: Self-pay | Source: Home / Self Care

## 2022-11-02 ENCOUNTER — Ambulatory Visit (HOSPITAL_COMMUNITY)
Admission: RE | Admit: 2022-11-02 | Payer: No Typology Code available for payment source | Source: Home / Self Care | Admitting: Urology

## 2022-11-02 SURGERY — NEPHRECTOMY, RADICAL, ROBOT-ASSISTED, LAPAROSCOPIC, ADULT
Anesthesia: General | Laterality: Left

## 2022-11-06 NOTE — Telephone Encounter (Signed)
Patient was in the office with Dr Darron Doom when called. Juanda Crumble, [place] Nurse Health Advisor Direct Dial [phone]

## 2022-11-08 ENCOUNTER — Telehealth (HOSPITAL_BASED_OUTPATIENT_CLINIC_OR_DEPARTMENT_OTHER): Payer: Self-pay

## 2022-11-09 DIAGNOSIS — C772 Secondary and unspecified malignant neoplasm of intra-abdominal lymph nodes: Secondary | ICD-10-CM | POA: Insufficient documentation

## 2022-11-09 DIAGNOSIS — C7801 Secondary malignant neoplasm of right lung: Secondary | ICD-10-CM | POA: Insufficient documentation

## 2022-11-12 ENCOUNTER — Ambulatory Visit: Payer: No Typology Code available for payment source | Admitting: Oncology

## 2022-12-21 ENCOUNTER — Telehealth (HOSPITAL_BASED_OUTPATIENT_CLINIC_OR_DEPARTMENT_OTHER): Payer: Self-pay

## 2022-12-21 NOTE — Telephone Encounter (Signed)
Nurse Navigator contacted the patient for initial intake but the patient did not answer the call. The NN Left a voice message for the patient to call back at [phone]. NN will follow up with the patient on 12/25/2022 if the patient does not call back.

## 2022-12-21 NOTE — Telephone Encounter (Signed)
NN was unable to reach the patient

## 2022-12-25 ENCOUNTER — Telehealth (HOSPITAL_BASED_OUTPATIENT_CLINIC_OR_DEPARTMENT_OTHER): Payer: Self-pay

## 2022-12-25 ENCOUNTER — Encounter (HOSPITAL_BASED_OUTPATIENT_CLINIC_OR_DEPARTMENT_OTHER): Payer: Self-pay

## 2022-12-25 NOTE — Telephone Encounter (Signed)
Nurse Navigator made a second attempt to contact the patient for initial intake but the patient did not answer the call. The NN Left a second voice message for the patient to call back at [phone]. NN will follow up with the patient on 01/01/23 if the patient does not call back.

## 2023-01-01 ENCOUNTER — Telehealth (HOSPITAL_BASED_OUTPATIENT_CLINIC_OR_DEPARTMENT_OTHER): Payer: Self-pay

## 2023-01-01 NOTE — Telephone Encounter (Signed)
Nurse Navigator made a 3rd and final attempt to contact the patient for initial intake but the patient did not answer the call. The NN Left a 3rd voice message on the patient's voicemail informing them that their referral to Dr. Anastasio Auerbach will be closed, but the NN will be happy to reopen it if they call back. NN informed the referring provider by voice message and left a call back number [phone].

## 2023-01-11 ENCOUNTER — Telehealth: Payer: Self-pay | Admitting: Oncology

## 2023-01-11 NOTE — Telephone Encounter (Signed)
Called patient and offered to schedule a follow up appointment. Patient stated she follow ups with [facility] and no longer sees shadad.

## 2023-01-27 DIAGNOSIS — R7989 Other specified abnormal findings of blood chemistry: Secondary | ICD-10-CM | POA: Insufficient documentation

## 2023-12-20 ENCOUNTER — Encounter (HOSPITAL_COMMUNITY): Payer: Self-pay

## 2023-12-20 ENCOUNTER — Emergency Department (HOSPITAL_COMMUNITY): Payer: No Typology Code available for payment source

## 2023-12-20 ENCOUNTER — Inpatient Hospital Stay (HOSPITAL_COMMUNITY)
Admission: EM | Admit: 2023-12-20 | Discharge: 2023-12-25 | DRG: 314 | Disposition: A | Discharge status: Home Health | Payer: No Typology Code available for payment source | Attending: Student | Admitting: Student

## 2023-12-20 ENCOUNTER — Other Ambulatory Visit: Payer: Self-pay

## 2023-12-20 DIAGNOSIS — T80211A Bloodstream infection due to central venous catheter, initial encounter: Principal | ICD-10-CM | POA: Diagnosis present

## 2023-12-20 DIAGNOSIS — D61818 Other pancytopenia: Secondary | ICD-10-CM

## 2023-12-20 DIAGNOSIS — A408 Other streptococcal sepsis: Secondary | ICD-10-CM | POA: Diagnosis present

## 2023-12-20 DIAGNOSIS — E039 Hypothyroidism, unspecified: Secondary | ICD-10-CM | POA: Diagnosis not present

## 2023-12-20 DIAGNOSIS — T451X5A Adverse effect of antineoplastic and immunosuppressive drugs, initial encounter: Secondary | ICD-10-CM | POA: Diagnosis present

## 2023-12-20 DIAGNOSIS — C689 Malignant neoplasm of urinary organ, unspecified: Secondary | ICD-10-CM | POA: Diagnosis not present

## 2023-12-20 DIAGNOSIS — B955 Unspecified streptococcus as the cause of diseases classified elsewhere: Secondary | ICD-10-CM | POA: Diagnosis not present

## 2023-12-20 DIAGNOSIS — Y838 Other surgical procedures as the cause of abnormal reaction of the patient, or of later complication, without mention of misadventure at the time of the procedure: Secondary | ICD-10-CM | POA: Diagnosis present

## 2023-12-20 DIAGNOSIS — Z90710 Acquired absence of both cervix and uterus: Secondary | ICD-10-CM | POA: Diagnosis not present

## 2023-12-20 DIAGNOSIS — D61810 Antineoplastic chemotherapy induced pancytopenia: Secondary | ICD-10-CM | POA: Diagnosis present

## 2023-12-20 DIAGNOSIS — E878 Other disorders of electrolyte and fluid balance, not elsewhere classified: Secondary | ICD-10-CM | POA: Diagnosis present

## 2023-12-20 DIAGNOSIS — K529 Noninfective gastroenteritis and colitis, unspecified: Secondary | ICD-10-CM | POA: Diagnosis present

## 2023-12-20 DIAGNOSIS — E860 Dehydration: Secondary | ICD-10-CM | POA: Diagnosis present

## 2023-12-20 DIAGNOSIS — F419 Anxiety disorder, unspecified: Secondary | ICD-10-CM | POA: Diagnosis present

## 2023-12-20 DIAGNOSIS — C7800 Secondary malignant neoplasm of unspecified lung: Secondary | ICD-10-CM | POA: Diagnosis present

## 2023-12-20 DIAGNOSIS — Z1152 Encounter for screening for COVID-19: Secondary | ICD-10-CM

## 2023-12-20 DIAGNOSIS — E222 Syndrome of inappropriate secretion of antidiuretic hormone: Secondary | ICD-10-CM | POA: Diagnosis present

## 2023-12-20 DIAGNOSIS — I38 Endocarditis, valve unspecified: Secondary | ICD-10-CM | POA: Diagnosis not present

## 2023-12-20 DIAGNOSIS — R7881 Bacteremia: Secondary | ICD-10-CM | POA: Diagnosis not present

## 2023-12-20 DIAGNOSIS — E861 Hypovolemia: Secondary | ICD-10-CM | POA: Diagnosis present

## 2023-12-20 DIAGNOSIS — R5081 Fever presenting with conditions classified elsewhere: Secondary | ICD-10-CM | POA: Diagnosis present

## 2023-12-20 DIAGNOSIS — Z9079 Acquired absence of other genital organ(s): Secondary | ICD-10-CM

## 2023-12-20 DIAGNOSIS — C652 Malignant neoplasm of left renal pelvis: Secondary | ICD-10-CM | POA: Diagnosis present

## 2023-12-20 DIAGNOSIS — E871 Hypo-osmolality and hyponatremia: Secondary | ICD-10-CM | POA: Diagnosis not present

## 2023-12-20 DIAGNOSIS — I330 Acute and subacute infective endocarditis: Secondary | ICD-10-CM | POA: Diagnosis present

## 2023-12-20 DIAGNOSIS — Z888 Allergy status to other drugs, medicaments and biological substances status: Secondary | ICD-10-CM | POA: Diagnosis not present

## 2023-12-20 DIAGNOSIS — K5150 Left sided colitis without complications: Secondary | ICD-10-CM | POA: Diagnosis present

## 2023-12-20 DIAGNOSIS — C642 Malignant neoplasm of left kidney, except renal pelvis: Secondary | ICD-10-CM | POA: Diagnosis not present

## 2023-12-20 DIAGNOSIS — Z79899 Other long term (current) drug therapy: Secondary | ICD-10-CM | POA: Diagnosis not present

## 2023-12-20 DIAGNOSIS — L659 Nonscarring hair loss, unspecified: Secondary | ICD-10-CM | POA: Diagnosis present

## 2023-12-20 DIAGNOSIS — C786 Secondary malignant neoplasm of retroperitoneum and peritoneum: Secondary | ICD-10-CM | POA: Diagnosis present

## 2023-12-20 DIAGNOSIS — D649 Anemia, unspecified: Principal | ICD-10-CM

## 2023-12-20 DIAGNOSIS — D709 Neutropenia, unspecified: Secondary | ICD-10-CM

## 2023-12-20 DIAGNOSIS — E876 Hypokalemia: Secondary | ICD-10-CM | POA: Diagnosis present

## 2023-12-20 DIAGNOSIS — B954 Other streptococcus as the cause of diseases classified elsewhere: Secondary | ICD-10-CM | POA: Diagnosis not present

## 2023-12-20 DIAGNOSIS — Z905 Acquired absence of kidney: Secondary | ICD-10-CM

## 2023-12-20 LAB — COMPREHENSIVE METABOLIC PANEL
ALT: 14 U/L (ref 0–44)
AST: 5 U/L — ABNORMAL LOW (ref 15–41)
Albumin: 2.2 g/dL — ABNORMAL LOW (ref 3.5–5.0)
Alkaline Phosphatase: 85 U/L (ref 38–126)
Anion gap: 11 (ref 5–15)
BUN: 20 mg/dL (ref 6–20)
CO2: 22 mmol/L (ref 22–32)
Calcium: 7.7 mg/dL — ABNORMAL LOW (ref 8.9–10.3)
Chloride: 82 mmol/L — ABNORMAL LOW (ref 98–111)
Creatinine, Ser: 0.96 mg/dL (ref 0.44–1.00)
GFR, Estimated: >60 mL/min (ref >60)
Glucose, Bld: 96 mg/dL (ref 70–99)
Potassium: 3.4 mmol/L — ABNORMAL LOW (ref 3.5–5.1)
Sodium: 115 mmol/L — CL (ref 135–145)
Total Bilirubin: 1.7 mg/dL — ABNORMAL HIGH (ref <1.2)
Total Protein: 5.5 g/dL — ABNORMAL LOW (ref 6.5–8.1)

## 2023-12-20 LAB — BASIC METABOLIC PANEL
Anion gap: 11 (ref 5–15)
BUN: 18 mg/dL (ref 6–20)
CO2: 20 mmol/L — ABNORMAL LOW (ref 22–32)
Calcium: 6.9 mg/dL — ABNORMAL LOW (ref 8.9–10.3)
Chloride: 89 mmol/L — ABNORMAL LOW (ref 98–111)
Creatinine, Ser: 0.74 mg/dL (ref 0.44–1.00)
GFR, Estimated: >60 mL/min (ref >60)
Glucose, Bld: 140 mg/dL — ABNORMAL HIGH (ref 70–99)
Potassium: 4.4 mmol/L (ref 3.5–5.1)
Sodium: 120 mmol/L — ABNORMAL LOW (ref 135–145)

## 2023-12-20 LAB — CBC WITH DIFFERENTIAL/PLATELET
Abs Immature Granulocytes: 0.05 10*3/uL (ref 0.00–0.07)
Basophils Absolute: 0 10*3/uL (ref 0.0–0.1)
Basophils Relative: 0 %
Eosinophils Absolute: 0 10*3/uL (ref 0.0–0.5)
Eosinophils Relative: 12 %
HCT: 18.7 % — ABNORMAL LOW (ref 36.0–46.0)
Hemoglobin: 6.8 g/dL — CL (ref 12.0–15.0)
Immature Granulocytes: 15 %
Lymphocytes Relative: 15 %
Lymphs Abs: 0.1 10*3/uL — ABNORMAL LOW (ref 0.7–4.0)
MCH: 30.9 pg (ref 26.0–34.0)
MCHC: 36.4 g/dL — ABNORMAL HIGH (ref 30.0–36.0)
MCV: 85 fL (ref 80.0–100.0)
Monocytes Absolute: 0.1 10*3/uL (ref 0.1–1.0)
Monocytes Relative: 27 %
Neutro Abs: 0.1 10*3/uL — CL (ref 1.7–7.7)
Neutrophils Relative %: 31 %
Platelets: 149 10*3/uL — ABNORMAL LOW (ref 150–400)
RBC: 2.2 MIL/uL — ABNORMAL LOW (ref 3.87–5.11)
RDW: 13.3 % (ref 11.5–15.5)
WBC: 0.3 10*3/uL — CL (ref 4.0–10.5)
nRBC: 0 % (ref 0.0–0.2)

## 2023-12-20 LAB — URINALYSIS, ROUTINE W REFLEX MICROSCOPIC
Bilirubin Urine: NEGATIVE
Glucose, UA: NEGATIVE mg/dL
Hgb urine dipstick: NEGATIVE
Ketones, ur: NEGATIVE mg/dL
Leukocytes,Ua: NEGATIVE
Nitrite: NEGATIVE
Protein, ur: 30 mg/dL — AB
Specific Gravity, Urine: 1.016 (ref 1.005–1.030)
pH: 5 (ref 5.0–8.0)

## 2023-12-20 LAB — RESP PANEL BY RT-PCR (RSV, FLU A&B, COVID)  RVPGX2
Influenza A by PCR: NEGATIVE
Influenza B by PCR: NEGATIVE
Resp Syncytial Virus by PCR: NEGATIVE
SARS Coronavirus 2 by RT PCR: NEGATIVE

## 2023-12-20 LAB — LIPASE, BLOOD: Lipase: 18 U/L (ref 11–51)

## 2023-12-20 LAB — I-STAT CG4 LACTIC ACID, ED
Lactic Acid, Venous: 0.5 mmol/L (ref 0.5–1.9)
Lactic Acid, Venous: 0.5 mmol/L (ref 0.5–1.9)

## 2023-12-20 LAB — PROTIME-INR
INR: 1.7 — ABNORMAL HIGH (ref 0.8–1.2)
Prothrombin Time: 20.3 s — ABNORMAL HIGH (ref 11.4–15.2)

## 2023-12-20 LAB — APTT: aPTT: 26 s (ref 24–36)

## 2023-12-20 LAB — PREPARE RBC (CROSSMATCH)

## 2023-12-20 MED ORDER — LACTATED RINGERS IV BOLUS (SEPSIS)
1000.0000 mL | Freq: Once | INTRAVENOUS | Status: AC
  Administered 2023-12-20 (×2): 1000 mL via INTRAVENOUS

## 2023-12-20 MED ORDER — HYDROMORPHONE HCL 1 MG/ML IJ SOLN
0.5000 mg | Freq: Once | INTRAMUSCULAR | Status: AC
  Administered 2023-12-20: 0.5 mg via INTRAVENOUS
  Filled 2023-12-20: qty 1

## 2023-12-20 MED ORDER — SODIUM CHLORIDE 0.9% IV SOLUTION: Freq: Once | INTRAVENOUS | Status: AC

## 2023-12-20 MED ORDER — SODIUM CHLORIDE 0.9% FLUSH: 10.0000 mL | INTRAVENOUS | Status: DC | PRN

## 2023-12-20 MED ORDER — IOHEXOL 300 MG/ML  SOLN
100.0000 mL | Freq: Once | INTRAMUSCULAR | Status: AC | PRN
  Administered 2023-12-20: 100 mL via INTRAVENOUS

## 2023-12-20 MED ORDER — SODIUM CHLORIDE 0.9 % IV SOLN
2.0000 g | Freq: Once | INTRAVENOUS | Status: AC
  Administered 2023-12-20: 2 g via INTRAVENOUS
  Filled 2023-12-20: qty 12.5

## 2023-12-20 MED ORDER — METHADONE HCL 10 MG PO TABS
5.0000 mg | ORAL_TABLET | Freq: Three times a day (TID) | ORAL | Status: DC
  Administered 2023-12-20 – 2023-12-25 (×13): 5 mg via ORAL
  Filled 2023-12-20 (×14): qty 1

## 2023-12-20 MED ORDER — SODIUM CHLORIDE 0.9 % IV SOLN
2.0000 g | INTRAVENOUS | Status: AC
  Administered 2023-12-21 – 2023-12-24 (×4): 2 g via INTRAVENOUS
  Filled 2023-12-20 (×4): qty 20

## 2023-12-20 MED ORDER — SODIUM CHLORIDE 0.9 % IV BOLUS
1000.0000 mL | Freq: Once | INTRAVENOUS | Status: AC
  Administered 2023-12-20: 1000 mL via INTRAVENOUS

## 2023-12-20 MED ORDER — MORPHINE SULFATE (PF) 2 MG/ML IV SOLN
2.0000 mg | INTRAVENOUS | Status: DC | PRN
  Administered 2023-12-20 – 2023-12-21 (×4): 2 mg via INTRAVENOUS
  Filled 2023-12-20 (×4): qty 1

## 2023-12-20 MED ORDER — VANCOMYCIN HCL IN DEXTROSE 1-5 GM/200ML-% IV SOLN
1000.0000 mg | Freq: Once | INTRAVENOUS | Status: AC
  Administered 2023-12-20: 1000 mg via INTRAVENOUS
  Filled 2023-12-20: qty 200

## 2023-12-20 MED ORDER — LACTATED RINGERS IV BOLUS (SEPSIS)
500.0000 mL | Freq: Once | INTRAVENOUS | Status: AC
  Administered 2023-12-20: 500 mL via INTRAVENOUS

## 2023-12-20 MED ORDER — ACETAMINOPHEN 325 MG PO TABS: 650.0000 mg | ORAL_TABLET | Freq: Four times a day (QID) | ORAL | Status: DC | PRN

## 2023-12-20 MED ORDER — OXYCODONE HCL 5 MG PO TABS
30.0000 mg | ORAL_TABLET | ORAL | Status: DC | PRN
  Administered 2023-12-21: 30 mg via ORAL
  Filled 2023-12-20: qty 6

## 2023-12-20 MED ORDER — METRONIDAZOLE 500 MG/100ML IV SOLN
500.0000 mg | Freq: Once | INTRAVENOUS | Status: AC
  Administered 2023-12-20: 500 mg via INTRAVENOUS
  Filled 2023-12-20: qty 100

## 2023-12-20 MED ORDER — METRONIDAZOLE 500 MG/100ML IV SOLN
500.0000 mg | Freq: Two times a day (BID) | INTRAVENOUS | Status: DC
  Administered 2023-12-21 – 2023-12-23 (×5): 500 mg via INTRAVENOUS
  Filled 2023-12-20 (×6): qty 100

## 2023-12-20 MED ORDER — LACTATED RINGERS IV SOLN: INTRAVENOUS | Status: DC

## 2023-12-20 MED ORDER — LACTATED RINGERS IV BOLUS (SEPSIS)
250.0000 mL | Freq: Once | INTRAVENOUS | Status: AC
Start: 2023-12-20 — End: 2023-12-20
  Administered 2023-12-20: 250 mL via INTRAVENOUS

## 2023-12-20 MED ORDER — SODIUM CHLORIDE 0.9% FLUSH
10.0000 mL | Freq: Two times a day (BID) | INTRAVENOUS | Status: DC
  Administered 2023-12-21 – 2023-12-22 (×4): 10 mL
  Administered 2023-12-22: 20 mL
  Administered 2023-12-23: 10 mL

## 2023-12-20 MED ORDER — SODIUM CHLORIDE 0.9 % IV BOLUS: 1000.0000 mL | Freq: Once | INTRAVENOUS | Status: DC

## 2023-12-20 MED ORDER — SODIUM CHLORIDE 0.9 % IV SOLN
INTRAVENOUS | Status: DC
Start: 2023-12-20 — End: 2023-12-21

## 2023-12-20 MED ORDER — CHLORHEXIDINE GLUCONATE CLOTH 2 % EX PADS
6.0000 | MEDICATED_PAD | Freq: Every day | CUTANEOUS | Status: DC
  Administered 2023-12-22 – 2023-12-25 (×3): 6 via TOPICAL

## 2023-12-20 MED ORDER — MORPHINE SULFATE (PF) 4 MG/ML IV SOLN
4.0000 mg | Freq: Once | INTRAVENOUS | Status: AC
  Administered 2023-12-20: 4 mg via INTRAVENOUS
  Filled 2023-12-20: qty 1

## 2023-12-20 NOTE — ED Provider Notes (Signed)
I provided a substantive portion of the care of this patient.  I personally made/approved the management plan for this patient and take responsibility for the patient management.  EKG Interpretation Date/Time:  Friday December 20 2023 13:04:57 EST Ventricular Rate:  80 PR Interval:  135 QRS Duration:  120 QT Interval:  403 QTC Calculation: 465 R Axis:   38  Text Interpretation: Sinus rhythm Nonspecific intraventricular conduction delay Borderline T abnormalities, anterior leads Confirmed by Lorre Nick ([id number]) on 12/20/2023 2:26:52 PM   47 year old female with history of cancer presents with fever as well as low blood pressure.  Concern for possible sepsis.  Patient was started on IV fluids, IV antibiotics and will require admission   Lorre Nick, MD 12/20/23 [phone]

## 2023-12-20 NOTE — ED Notes (Signed)
..ED TO INPATIENT HANDOFF REPORT  Name/Age/Gender Barbara Nicholson 46 y.o. female  Code Status Code Status History     Date Active Date Inactive Code Status Order ID Comments User Context   04/10/2022 0531 04/11/2022 1415 Full Code [id number]  Marcelle Overlie, MD Inpatient   10/13/2013 0439 10/16/2013 1425 Full Code [id number]  Jeani Hawking, MD Inpatient       Home/SNF/Other Home  Chief Complaint Neutropenic fever (HCC) [D70.9, R50.81]  Level of Care/Admitting Diagnosis ED Disposition     ED Disposition  Admit   Condition  --   Comment  Hospital Area: [facility] [[id number]]  Level of Care: Progressive [102]  Admit to Progressive based on following criteria: MULTISYSTEM THREATS such as stable sepsis, metabolic/electrolyte imbalance with or without encephalopathy that is responding to early treatment.  May admit patient to [facility] or [facility] if equivalent level of care is available:: No  Covid Evaluation: Asymptomatic - no recent exposure (last 10 days) testing not required  Diagnosis: Neutropenic fever [facility]) [[id number]]  Admitting Physician: Anselm Jungling [[id number]]  Attending Physician: Anselm Jungling [[id number]]  Certification:: I certify this patient will need inpatient services for at least 2 midnights  Expected Medical Readiness: 12/23/2023          Medical History Past Medical History:  Diagnosis Date   AMA (advanced maternal age) multigravida 35+    Anemia    hx of anemia due to fibroids   B12 deficiency 2015   Concussion with < 1 hr loss of consciousness 09/10/2018   Patient fell at home and hit her head. HA, confusion, amnesia around the time of the accident.   Generalized anxiety disorder 2021   work-related, better as of 04/06/22   Heart murmur    as a baby   Hx of ulcerative colitis    Malignant neoplasm (HCC) 2023   kidney, lungs - On chemotherapy   Syncope 03/14/2014   syncope after epidsode of explosive diarrhea   Ulcerative  colitis (HCC)    hx of left sided colitis 05/23/18   Wears contact lenses    Wears glasses     Allergies Allergies  Allergen Reactions   Piperacillin Sod-Tazobactam So Anaphylaxis and Other (See Comments)    First dose- swollen lips, scratchy throat   Zosyn [Piperacillin-Tazobactam In Dex] Anaphylaxis    IV Location/Drains/Wounds Patient Lines/Drains/Airways Status     Active Line/Drains/Airways     Name Placement date Placement time Site Days   Peripheral IV 12/20/23 20 G Left Antecubital 12/20/23  1433  Antecubital  less than 1            Labs/Imaging Results for orders placed or performed during the hospital encounter of 12/20/23 (from the past 48 hours)  CBC with Differential     Status: Abnormal   Collection Time: 12/20/23  2:29 PM  Result Value Ref Range   WBC 0.3 (LL) 4.0 - 10.5 K/uL    Comment: This critical result has verified and been called to [facility] RN by Pattricia Boss on 12 27 2024 at 1546, and has been read back.    RBC 2.20 (L) 3.87 - 5.11 MIL/uL   Hemoglobin 6.8 (LL) 12.0 - 15.0 g/dL    Comment: This critical result has verified and been called to [facility] RN by Pattricia Boss on 12 27 2024 at 1546, and has been read back.    HCT 18.7 (L) 36.0 - 46.0 %   MCV 85.0 80.0 -  100.0 fL   MCH 30.9 26.0 - 34.0 pg   MCHC 36.4 (H) 30.0 - 36.0 g/dL   RDW 29.5 28.4 - 13.2 %   Platelets 149 (L) 150 - 400 K/uL   nRBC 0.0 0.0 - 0.2 %   Neutrophils Relative % 31 %   Neutro Abs 0.1 (LL) 1.7 - 7.7 K/uL    Comment: This critical result has verified and been called to [facility] RN by Pattricia Boss on 12 27 2024 at 1609, and has been read back.    Lymphocytes Relative 15 %   Lymphs Abs 0.1 (L) 0.7 - 4.0 K/uL   Monocytes Relative 27 %   Monocytes Absolute 0.1 0.1 - 1.0 K/uL   Eosinophils Relative 12 %   Eosinophils Absolute 0.0 0.0 - 0.5 K/uL   Basophils Relative 0 %   Basophils Absolute 0.0 0.0 - 0.1 K/uL   Immature Granulocytes 15 %   Abs Immature  Granulocytes 0.05 0.00 - 0.07 K/uL    Comment: Performed at [facility], [address], [place], [place] [address]  Comprehensive metabolic panel     Status: Abnormal   Collection Time: 12/20/23  2:29 PM  Result Value Ref Range   Sodium 115 (LL) 135 - 145 mmol/L    Comment: CRITICAL RESULT CALLED TO, READ BACK BY AND VERIFIED WITH HOLLATZ, C RN AT 1549 12/20/23 TUCKER, J    Potassium 3.4 (L) 3.5 - 5.1 mmol/L   Chloride 82 (L) 98 - 111 mmol/L   CO2 22 22 - 32 mmol/L   Glucose, Bld 96 70 - 99 mg/dL    Comment: Glucose reference range applies only to samples taken after fasting for at least 8 hours.   BUN 20 6 - 20 mg/dL   Creatinine, Ser 2.72 0.44 - 1.00 mg/dL   Calcium 7.7 (L) 8.9 - 10.3 mg/dL   Total Protein 5.5 (L) 6.5 - 8.1 g/dL   Albumin 2.2 (L) 3.5 - 5.0 g/dL   AST 5 (L) 15 - 41 U/L   ALT 14 0 - 44 U/L   Alkaline Phosphatase 85 38 - 126 U/L   Total Bilirubin 1.7 (H) <1.2 mg/dL   GFR, Estimated >53 >66 mL/min    Comment: (NOTE) Calculated using the CKD-EPI Creatinine Equation (2021)    Anion gap 11 5 - 15    Comment: Performed at [facility], [address] [place], [place] [address]  Lipase, blood     Status: None   Collection Time: 12/20/23  2:29 PM  Result Value Ref Range   Lipase 18 11 - 51 U/L    Comment: Performed at [facility], [address], [place], [place] [address]  Resp panel by RT-PCR (RSV, Flu A&B, Covid) Anterior Nasal Swab     Status: None   Collection Time: 12/20/23  2:29 PM   Specimen: Anterior Nasal Swab  Result Value Ref Range   SARS Coronavirus 2 by RT PCR NEGATIVE NEGATIVE    Comment: (NOTE) SARS-CoV-2 target nucleic acids are NOT DETECTED.  The SARS-CoV-2 RNA is generally detectable in upper respiratory specimens during the acute phase of infection. The lowest concentration of SARS-CoV-2 viral copies this assay can detect is 138 copies/mL. A negative result does not preclude  SARS-Cov-2 infection and should not be used as the sole basis for treatment or other patient management decisions. A negative result may occur with  improper specimen collection/handling, submission of specimen other than nasopharyngeal swab, presence  of viral mutation(s) within the areas targeted by this assay, and inadequate number of viral copies(<138 copies/mL). A negative result must be combined with clinical observations, patient history, and epidemiological information. The expected result is Negative.  Fact Sheet for Patients:  BloggerCourse.com  Fact Sheet for Healthcare Providers:  SeriousBroker.it  This test is no t yet approved or cleared by the [place] FDA and  has been authorized for detection and/or diagnosis of SARS-CoV-2 by FDA under an Emergency Use Authorization (EUA). This EUA will remain  in effect (meaning this test can be used) for the duration of the COVID-19 declaration under Section 564(b)(1) of the Act, 21 U.S.C.section 360bbb-3(b)(1), unless the authorization is terminated  or revoked sooner.       Influenza A by PCR NEGATIVE NEGATIVE   Influenza B by PCR NEGATIVE NEGATIVE    Comment: (NOTE) The Xpert Xpress SARS-CoV-2/FLU/RSV plus assay is intended as an aid in the diagnosis of influenza from Nasopharyngeal swab specimens and should not be used as a sole basis for treatment. Nasal washings and aspirates are unacceptable for Xpert Xpress SARS-CoV-2/FLU/RSV testing.  Fact Sheet for Patients: BloggerCourse.com  Fact Sheet for Healthcare Providers: SeriousBroker.it  This test is not yet approved or cleared by the [place] FDA and has been authorized for detection and/or diagnosis of SARS-CoV-2 by FDA under an Emergency Use Authorization (EUA). This EUA will remain in effect (meaning this test can be used) for the duration of the COVID-19  declaration under Section 564(b)(1) of the Act, 21 U.S.C. section 360bbb-3(b)(1), unless the authorization is terminated or revoked.     Resp Syncytial Virus by PCR NEGATIVE NEGATIVE    Comment: (NOTE) Fact Sheet for Patients: BloggerCourse.com  Fact Sheet for Healthcare Providers: SeriousBroker.it  This test is not yet approved or cleared by the [place] FDA and has been authorized for detection and/or diagnosis of SARS-CoV-2 by FDA under an Emergency Use Authorization (EUA). This EUA will remain in effect (meaning this test can be used) for the duration of the COVID-19 declaration under Section 564(b)(1) of the Act, 21 U.S.C. section 360bbb-3(b)(1), unless the authorization is terminated or revoked.  Performed at [facility], [address] [place], [place] [address]   Protime-INR     Status: Abnormal   Collection Time: 12/20/23  2:29 PM  Result Value Ref Range   Prothrombin Time 20.3 (H) 11.4 - 15.2 seconds   INR 1.7 (H) 0.8 - 1.2    Comment: (NOTE) INR goal varies based on device and disease states. Performed at [facility], [address], [place], [place] [address]   APTT     Status: None   Collection Time: 12/20/23  2:29 PM  Result Value Ref Range   aPTT 26 24 - 36 seconds    Comment: Performed at [facility], [address] [place], [place] [address]  I-Stat CG4 Lactic Acid     Status: None   Collection Time: 12/20/23  3:08 PM  Result Value Ref Range   Lactic Acid, Venous 0.5 0.5 - 1.9 mmol/L  Urinalysis, Routine w reflex microscopic -Urine, Clean Catch     Status: Abnormal   Collection Time: 12/20/23  3:51 PM  Result Value Ref Range   Color, Urine AMBER (A) YELLOW    Comment: BIOCHEMICALS MAY BE AFFECTED BY COLOR   APPearance CLEAR CLEAR   Specific Gravity, Urine 1.016 1.005 - 1.030   pH 5.0 5.0 - 8.0   Glucose, UA  NEGATIVE NEGATIVE mg/dL    Hgb urine dipstick NEGATIVE NEGATIVE   Bilirubin Urine NEGATIVE NEGATIVE   Ketones, ur NEGATIVE NEGATIVE mg/dL   Protein, ur 30 (A) NEGATIVE mg/dL   Nitrite NEGATIVE NEGATIVE   Leukocytes,Ua NEGATIVE NEGATIVE   RBC / HPF 0-5 0 - 5 RBC/hpf   WBC, UA 6-10 0 - 5 WBC/hpf   Bacteria, UA RARE (A) NONE SEEN   Squamous Epithelial / HPF 0-5 0 - 5 /HPF    Comment: Performed at [facility], [address] [place], [place] [address]  Type and screen [facility]     Status: None (Preliminary result)   Collection Time: 12/20/23  5:20 PM  Result Value Ref Range   ABO/RH(D) A POS    Antibody Screen NEG    Sample Expiration 12/23/2023,2359    Unit Number [id number]    Blood Component Type RED CELLS,LR    Unit division 00    Status of Unit ISSUED    Transfusion Status OK TO TRANSFUSE    Crossmatch Result      Compatible Performed at [facility], [address] [place], [place] [address]   Prepare RBC (crossmatch)     Status: None   Collection Time: 12/20/23  5:20 PM  Result Value Ref Range   Order Confirmation      ORDER PROCESSED BY BLOOD BANK Performed at [facility], [address] [place], [place] [address]   I-Stat Lactic Acid, ED     Status: None   Collection Time: 12/20/23  5:31 PM  Result Value Ref Range   Lactic Acid, Venous 0.5 0.5 - 1.9 mmol/L  Basic metabolic panel     Status: Abnormal   Collection Time: 12/20/23  7:54 PM  Result Value Ref Range   Sodium 120 (L) 135 - 145 mmol/L   Potassium 4.4 3.5 - 5.1 mmol/L   Chloride 89 (L) 98 - 111 mmol/L   CO2 20 (L) 22 - 32 mmol/L   Glucose, Bld 140 (H) 70 - 99 mg/dL    Comment: Glucose reference range applies only to samples taken after fasting for at least 8 hours.   BUN 18 6 - 20 mg/dL   Creatinine, Ser 8.65 0.44 - 1.00 mg/dL   Calcium 6.9 (L) 8.9 - 10.3 mg/dL   GFR, Estimated >78 >46 mL/min    Comment: (NOTE) Calculated using the CKD-EPI  Creatinine Equation (2021)    Anion gap 11 5 - 15    Comment: Performed at [facility], [address], [place], [place] [address]   CT ABDOMEN PELVIS W CONTRAST Result Date: 12/20/2023 CLINICAL DATA:  Acute abdominal pain and weakness, history of ureteral cancer with recent chemotherapy. EXAM: CT ABDOMEN AND PELVIS WITH CONTRAST TECHNIQUE: Multidetector CT imaging of the abdomen and pelvis was performed using the standard protocol following bolus administration of intravenous contrast. RADIATION DOSE REDUCTION: This exam was performed according to the departmental dose-optimization program which includes automated exposure control, adjustment of the mA and/or kV according to patient size and/or use of iterative reconstruction technique. CONTRAST:  OMNIPAQUE IOHEXOL 300 MG/ML  SOLN COMPARISON:  10/25/2022 PET-CT, by report from a recent exam dated 11/26/2023 at [facility]. FINDINGS: Lower chest: No acute abnormality. Hepatobiliary: Liver is within normal limits. Gallbladder is decompressed. Pancreas: Unremarkable. No pancreatic ductal dilatation or surrounding inflammatory changes. Spleen: Normal in size without focal abnormality. Adrenals/Urinary Tract: Adrenal glands are within normal limits bilaterally. Right  kidney demonstrates normal enhancement pattern. No renal calculi or obstructive changes are seen. Left kidney has been surgically removed. Several peripherally enhancing lesions are identified in the left nephrectomy bed. Superiorly a 2.9 cm lesion is noted. More inferiorly a dominant 3.4 cm lesion is seen as well as a small lesion adjacent to the left ribcage on image number 37 which measures approximately 15 mm. Adjacent to the left psoas muscle, there is a peripherally enhancing lesion with air and fluid within which abuts the descending colon best seen on image number 48 of series 2. There also appears to be invasion into the descending colonic wall with passage of  fecal material into the necrotic lesion. Additionally multiple serosal implants are noted adjacent to the sigmoid colon. The largest of these measures approximately 4.9 x 3.8 cm. This is increased from a recent CT examination obtained at [facility] on 11/26/2023. Stomach/Bowel: No obstructive changes of the colon are seen. There are again noted multiple serosal implants along the sigmoid and descending colon. The descending colon lesion appears to communicate with the lumen of the colon. More proximal transverse colon appears within normal limits. The ascending colon and cecum demonstrates significant wall thickening with mucosal enhancement. These changes may be reactive in nature to the adjacent peritoneal and serosal implants adjacent to the sigmoid colon. The small bowel shows no obstructive changes. Stomach is within normal limits. Vascular/Lymphatic: Atherosclerotic calcifications are seen. A cystic lesion is again noted in the left periaortic space measuring 5.0 x 4.4 cm roughly stable from the prior exam with a small intra-aortocaval component previously described. Reproductive: Uterus has been surgically removed. No adnexal mass is noted. Other: Multiple peripherally enhancing peritoneal implants are noted along the dome of the bladder which by a report are stable. Musculoskeletal: No acute or significant osseous findings. IMPRESSION: Extensive metastatic disease within the left renal bed as well as diffusely throughout the peritoneum as described. These appear relatively stable when compared with the prior exam although 1 lesion along the posterior aspect of the descending colon appears increased in size with apparent invasion into the lumen of the descending colon. It now measures up to 4.6 cm and demonstrates air and fluid within which may be related to fecal material. It appears otherwise contained. Considerable wall thickening in the ascending colon and cecum. This likely represents focal colitis  new from the prior exam. Some adjacent enhancing peritoneal implants are noted near the cecum and invasion could not be totally excluded. Electronically Signed   By: Alcide Clever M.D.   On: 12/20/2023 17:56   DG Chest Portable 1 View Result Date: 12/20/2023 CLINICAL DATA:  Fever, chills, shortness of breath, and weakness. History of metastatic renal cell cancer. EXAM: PORTABLE CHEST 1 VIEW COMPARISON:  PET-CT dated October 25, 2022. FINDINGS: Right chest wall port catheter with tip in the proximal right atrium. The heart size and mediastinal contours are within normal limits. Normal pulmonary vascularity. No focal consolidation, pleural effusion, or pneumothorax. No acute osseous abnormality. IMPRESSION: 1. No active disease. Electronically Signed   By: Obie Dredge M.D.   On: 12/20/2023 16:08    Pending Labs Unresulted Labs (From admission, onward)     Start     Ordered   12/20/23 1332  Blood culture (routine x 2)  BLOOD CULTURE X 2,   R (with STAT occurrences)      12/20/23 1331            Vitals/Pain Today's Vitals   12/20/23 1931 12/20/23  2031 12/20/23 2100 12/20/23 2117  BP:  (!) 93/57 98/70 92/67   Pulse:  72 81 75  Resp:  14 14 17   Temp:    98.2 F (36.8 C)  TempSrc:    Oral  SpO2:  97% 99% 99%  Weight:      Height:      PainSc: 6        Isolation Precautions No active isolations  Medications Medications  0.9 %  sodium chloride infusion ( Intravenous New Bag/Given 12/20/23 2116)  HYDROmorphone (DILAUDID) injection 0.5 mg (has no administration in time range)  morphine (PF) 4 MG/ML injection 4 mg (4 mg Intravenous Given 12/20/23 1445)  ceFEPIme (MAXIPIME) 2 g in sodium chloride 0.9 % 100 mL IVPB (0 g Intravenous Stopped 12/20/23 1518)  metroNIDAZOLE (FLAGYL) IVPB 500 mg (0 mg Intravenous Stopped 12/20/23 1600)  vancomycin (VANCOCIN) IVPB 1000 mg/200 mL premix (0 mg Intravenous Stopped 12/20/23 1601)  lactated ringers bolus 1,000 mL (0 mLs Intravenous Stopped  12/20/23 1608)    And  lactated ringers bolus 500 mL (0 mLs Intravenous Stopped 12/20/23 1712)    And  lactated ringers bolus 250 mL (0 mLs Intravenous Stopped 12/20/23 1712)  0.9 %  sodium chloride infusion (Manually program via Guardrails IV Fluids) (0 mLs Intravenous Stopped 12/20/23 2116)  sodium chloride 0.9 % bolus 1,000 mL (0 mLs Intravenous Stopped 12/20/23 1829)  iohexol (OMNIPAQUE) 300 MG/ML solution 100 mL (100 mLs Intravenous Contrast Given 12/20/23 1703)    Mobility walks with person assist

## 2023-12-20 NOTE — ED Provider Notes (Signed)
Received signout from previous provider, please see his note for complete H&P.  This is a 47 year old female who was unfortunately has metastatic urothelial cancer currently on Sacituzumab.  Presenting today with complaints of fever as well as weakness.  She is endorsing having abdominal pain, fever as high as 101.5, and complaint of generalized weakness.  She also complaining of some night sweats and some trouble breathing as well as having some diarrhea for the past several days.  She is currently receiving her cancer care at [facility].  -Labs ordered, independently viewed and interpreted by me.  Labs remarkable for Na+ 115, Ca 7.7. WBC 0.3, and Hgb 6.8. -The patient was maintained on a cardiac monitor.  I personally viewed and interpreted the cardiac monitored which showed an underlying rhythm of: NSR -Imaging independently viewed and interpreted by me and I agree with radiologist's interpretation.  Result remarkable for abd/pelvis CT showing extensive metastatic disease with a new lesion that made invade into the lumen of the descending colon.  Also evidence of colitis. -This patient presents to the ED for concern of weakness, this involves an extensive number of treatment options, and is a complaint that carries with it a high risk of complications and morbidity.  The differential diagnosis includes neutropenic fever, symptomatic anemia, infection, electrolytes derangement, hypoglycemia -Co morbidities that complicate the patient evaluation includes urothelial cancer, UC, B12 deficiency, anemia -Treatment includes broad spectrum abx, fluid resuscitation, blood transfusion, morphine -Reevaluation of the patient after these medicines showed that the patient stayed the same -PCP office notes or outside notes reviewed -Discussion with specialist [facility] Dr. Cyndia Bent who agrees to admit pt but request for me to consult with pt's oncologist from [facility] for recommendation of neutropenic fever.   -Escalation to admission/observation considered: patient is agreeable with admission  Initially pt request for transfer to [facility].  I have discussed care with transfer team as well as oncologist DR. Oswalt who felt pt's sodium is too low to be admitted for the oncology floor without close monitoring.  He felt pt should be admitted to [facility].  I share this with pt and with Dr. Cyndia Bent who agrees to admit pt.    Repeat labs showing Na+ improves to 120.    .Critical Care  Performed by: Fayrene Helper, PA-C Authorized by: Fayrene Helper, PA-C   Critical care provider statement:    Critical care time (minutes):  40   Critical care was time spent personally by me on the following activities:  Development of treatment plan with patient or surrogate, discussions with consultants, evaluation of patient's response to treatment, examination of patient, ordering and review of laboratory studies, ordering and review of radiographic studies, ordering and performing treatments and interventions, pulse oximetry, re-evaluation of patient's condition and review of old charts  BP (!) 93/57   Pulse 72   Temp 98.2 F (36.8 C) (Oral)   Resp 14   Ht 5\' 4"  (1.626 m)   Wt 52.2 kg   LMP 03/16/2022 (Approximate)   SpO2 97%   BMI 19.74 kg/m   Results for orders placed or performed during the hospital encounter of 12/20/23  Resp panel by RT-PCR (RSV, Flu A&B, Covid) Anterior Nasal Swab   Collection Time: 12/20/23  2:29 PM   Specimen: Anterior Nasal Swab  Result Value Ref Range   SARS Coronavirus 2 by RT PCR NEGATIVE NEGATIVE   Influenza A by PCR NEGATIVE NEGATIVE   Influenza B by PCR NEGATIVE NEGATIVE   Resp Syncytial Virus  by PCR NEGATIVE NEGATIVE  CBC with Differential   Collection Time: 12/20/23  2:29 PM  Result Value Ref Range   WBC 0.3 (LL) 4.0 - 10.5 K/uL   RBC 2.20 (L) 3.87 - 5.11 MIL/uL   Hemoglobin 6.8 (LL) 12.0 - 15.0 g/dL   HCT 95.6 (L) 21.3 - 08.6 %   MCV 85.0 80.0 - 100.0 fL   MCH 30.9 26.0 - 34.0 pg    MCHC 36.4 (H) 30.0 - 36.0 g/dL   RDW 57.8 46.9 - 62.9 %   Platelets 149 (L) 150 - 400 K/uL   nRBC 0.0 0.0 - 0.2 %   Neutrophils Relative % 31 %   Neutro Abs 0.1 (LL) 1.7 - 7.7 K/uL   Lymphocytes Relative 15 %   Lymphs Abs 0.1 (L) 0.7 - 4.0 K/uL   Monocytes Relative 27 %   Monocytes Absolute 0.1 0.1 - 1.0 K/uL   Eosinophils Relative 12 %   Eosinophils Absolute 0.0 0.0 - 0.5 K/uL   Basophils Relative 0 %   Basophils Absolute 0.0 0.0 - 0.1 K/uL   Immature Granulocytes 15 %   Abs Immature Granulocytes 0.05 0.00 - 0.07 K/uL  Comprehensive metabolic panel   Collection Time: 12/20/23  2:29 PM  Result Value Ref Range   Sodium 115 (LL) 135 - 145 mmol/L   Potassium 3.4 (L) 3.5 - 5.1 mmol/L   Chloride 82 (L) 98 - 111 mmol/L   CO2 22 22 - 32 mmol/L   Glucose, Bld 96 70 - 99 mg/dL   BUN 20 6 - 20 mg/dL   Creatinine, Ser 5.28 0.44 - 1.00 mg/dL   Calcium 7.7 (L) 8.9 - 10.3 mg/dL   Total Protein 5.5 (L) 6.5 - 8.1 g/dL   Albumin 2.2 (L) 3.5 - 5.0 g/dL   AST 5 (L) 15 - 41 U/L   ALT 14 0 - 44 U/L   Alkaline Phosphatase 85 38 - 126 U/L   Total Bilirubin 1.7 (H) <1.2 mg/dL   GFR, Estimated >41 >32 mL/min   Anion gap 11 5 - 15  Lipase, blood   Collection Time: 12/20/23  2:29 PM  Result Value Ref Range   Lipase 18 11 - 51 U/L  Protime-INR   Collection Time: 12/20/23  2:29 PM  Result Value Ref Range   Prothrombin Time 20.3 (H) 11.4 - 15.2 seconds   INR 1.7 (H) 0.8 - 1.2  APTT   Collection Time: 12/20/23  2:29 PM  Result Value Ref Range   aPTT 26 24 - 36 seconds  I-Stat CG4 Lactic Acid   Collection Time: 12/20/23  3:08 PM  Result Value Ref Range   Lactic Acid, Venous 0.5 0.5 - 1.9 mmol/L  Urinalysis, Routine w reflex microscopic -Urine, Clean Catch   Collection Time: 12/20/23  3:51 PM  Result Value Ref Range   Color, Urine AMBER (A) YELLOW   APPearance CLEAR CLEAR   Specific Gravity, Urine 1.016 1.005 - 1.030   pH 5.0 5.0 - 8.0   Glucose, UA NEGATIVE NEGATIVE mg/dL   Hgb urine  dipstick NEGATIVE NEGATIVE   Bilirubin Urine NEGATIVE NEGATIVE   Ketones, ur NEGATIVE NEGATIVE mg/dL   Protein, ur 30 (A) NEGATIVE mg/dL   Nitrite NEGATIVE NEGATIVE   Leukocytes,Ua NEGATIVE NEGATIVE   RBC / HPF 0-5 0 - 5 RBC/hpf   WBC, UA 6-10 0 - 5 WBC/hpf   Bacteria, UA RARE (A) NONE SEEN   Squamous Epithelial / HPF 0-5 0 - 5 /HPF  Type and screen [facility]   Collection Time: 12/20/23  5:20 PM  Result Value Ref Range   ABO/RH(D) A POS    Antibody Screen NEG    Sample Expiration 12/23/2023,2359    Unit Number [id number]    Blood Component Type RED CELLS,LR    Unit division 00    Status of Unit ISSUED    Transfusion Status OK TO TRANSFUSE    Crossmatch Result      Compatible Performed at [facility], [address], [place], [place] [address]   Prepare RBC (crossmatch)   Collection Time: 12/20/23  5:20 PM  Result Value Ref Range   Order Confirmation      ORDER PROCESSED BY BLOOD BANK Performed at [facility], [address] [place], [place] [address]   BPAM [facility]   Collection Time: 12/20/23  5:20 PM  Result Value Ref Range   ISSUE DATE / TIME [id number]    Blood Product Unit Number [id number]    PRODUCT CODE E0382V00    Unit Type and Rh 6200    Blood Product Expiration Date 093235573220   I-Stat Lactic Acid, ED   Collection Time: 12/20/23  5:31 PM  Result Value Ref Range   Lactic Acid, Venous 0.5 0.5 - 1.9 mmol/L  Basic metabolic panel   Collection Time: 12/20/23  7:54 PM  Result Value Ref Range   Sodium 120 (L) 135 - 145 mmol/L   Potassium 4.4 3.5 - 5.1 mmol/L   Chloride 89 (L) 98 - 111 mmol/L   CO2 20 (L) 22 - 32 mmol/L   Glucose, Bld 140 (H) 70 - 99 mg/dL   BUN 18 6 - 20 mg/dL   Creatinine, Ser 2.54 0.44 - 1.00 mg/dL   Calcium 6.9 (L) 8.9 - 10.3 mg/dL   GFR, Estimated >27 >06 mL/min   Anion gap 11 5 - 15   CT ABDOMEN PELVIS W CONTRAST Result Date: 12/20/2023 CLINICAL DATA:  Acute  abdominal pain and weakness, history of ureteral cancer with recent chemotherapy. EXAM: CT ABDOMEN AND PELVIS WITH CONTRAST TECHNIQUE: Multidetector CT imaging of the abdomen and pelvis was performed using the standard protocol following bolus administration of intravenous contrast. RADIATION DOSE REDUCTION: This exam was performed according to the departmental dose-optimization program which includes automated exposure control, adjustment of the mA and/or kV according to patient size and/or use of iterative reconstruction technique. CONTRAST:  OMNIPAQUE IOHEXOL 300 MG/ML  SOLN COMPARISON:  10/25/2022 PET-CT, by report from a recent exam dated 11/26/2023 at [facility]. FINDINGS: Lower chest: No acute abnormality. Hepatobiliary: Liver is within normal limits. Gallbladder is decompressed. Pancreas: Unremarkable. No pancreatic ductal dilatation or surrounding inflammatory changes. Spleen: Normal in size without focal abnormality. Adrenals/Urinary Tract: Adrenal glands are within normal limits bilaterally. Right kidney demonstrates normal enhancement pattern. No renal calculi or obstructive changes are seen. Left kidney has been surgically removed. Several peripherally enhancing lesions are identified in the left nephrectomy bed. Superiorly a 2.9 cm lesion is noted. More inferiorly a dominant 3.4 cm lesion is seen as well as a small lesion adjacent to the left ribcage on image number 37 which measures approximately 15 mm. Adjacent to the left psoas muscle, there is a peripherally enhancing lesion with air and fluid within which abuts the descending colon best seen on image number 48 of series 2. There also appears to be invasion into the descending colonic wall with passage of fecal material into the necrotic lesion. Additionally multiple  serosal implants are noted adjacent to the sigmoid colon. The largest of these measures approximately 4.9 x 3.8 cm. This is increased from a recent CT examination obtained  at [facility] on 11/26/2023. Stomach/Bowel: No obstructive changes of the colon are seen. There are again noted multiple serosal implants along the sigmoid and descending colon. The descending colon lesion appears to communicate with the lumen of the colon. More proximal transverse colon appears within normal limits. The ascending colon and cecum demonstrates significant wall thickening with mucosal enhancement. These changes may be reactive in nature to the adjacent peritoneal and serosal implants adjacent to the sigmoid colon. The small bowel shows no obstructive changes. Stomach is within normal limits. Vascular/Lymphatic: Atherosclerotic calcifications are seen. A cystic lesion is again noted in the left periaortic space measuring 5.0 x 4.4 cm roughly stable from the prior exam with a small intra-aortocaval component previously described. Reproductive: Uterus has been surgically removed. No adnexal mass is noted. Other: Multiple peripherally enhancing peritoneal implants are noted along the dome of the bladder which by a report are stable. Musculoskeletal: No acute or significant osseous findings. IMPRESSION: Extensive metastatic disease within the left renal bed as well as diffusely throughout the peritoneum as described. These appear relatively stable when compared with the prior exam although 1 lesion along the posterior aspect of the descending colon appears increased in size with apparent invasion into the lumen of the descending colon. It now measures up to 4.6 cm and demonstrates air and fluid within which may be related to fecal material. It appears otherwise contained. Considerable wall thickening in the ascending colon and cecum. This likely represents focal colitis new from the prior exam. Some adjacent enhancing peritoneal implants are noted near the cecum and invasion could not be totally excluded. Electronically Signed   By: Alcide Clever M.D.   On: 12/20/2023 17:56   DG Chest Portable 1  View Result Date: 12/20/2023 CLINICAL DATA:  Fever, chills, shortness of breath, and weakness. History of metastatic renal cell cancer. EXAM: PORTABLE CHEST 1 VIEW COMPARISON:  PET-CT dated October 25, 2022. FINDINGS: Right chest wall port catheter with tip in the proximal right atrium. The heart size and mediastinal contours are within normal limits. Normal pulmonary vascularity. No focal consolidation, pleural effusion, or pneumothorax. No acute osseous abnormality. IMPRESSION: 1. No active disease. Electronically Signed   By: Obie Dredge M.D.   On: 12/20/2023 16:08      Fayrene Helper, PA-C 12/20/23 2049    Durwin Glaze, MD 12/20/23 2212
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
## 2023-12-20 NOTE — Sepsis Progress Note (Signed)
Elink following sepsis protocol.  

## 2023-12-20 NOTE — ED Notes (Signed)
A&O x 4.  Continues to complain of dizziness. Skin pale, dry and intact. No signs of distress. Family at bedside.

## 2023-12-20 NOTE — Assessment & Plan Note (Addendum)
-  Secondary to ascending colitis in the setting of recent new chemotherapy -Patient has received broad-spectrum antibiotics in the ED.  Will continue with IV Rocephin and Flagyl. -Patient reports improvement in her diarrhea since being in the ED.  Will test for stool study and C. difficile if diarrhea persist. -ED PA discussed with oncology fellow at [facility] regarding Neulasta or Granix but was advised to monitor with antibiotics. Will need to consult our oncology in the morning to revisit this

## 2023-12-20 NOTE — ED Triage Notes (Addendum)
Pt. BIB EMS from home. Pt. A&O x4. Per pt. Started Troveldy, new chemo drug on Dec. 11th and second dose Dec.18th. Pt. C/o  weakness, dizziness, diarrhea, SOB, fever/chills after last dose. Pt. Has hx of bladder cancer. Pt. Has port to right upper chest.

## 2023-12-20 NOTE — Assessment & Plan Note (Addendum)
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 CT imaging also concerning for peritoneal lesion invading into descending colon. May need GI evaluate following resolution of her infection.  -Continue pain management with home scheduled methadone 5 mg q8hr  -PRN IV morpine -home oxycodone 30mg  IR PRN for breakthrough pain - substance database has been verified

## 2023-12-20 NOTE — ED Notes (Signed)
Blood bank has a unit of blood ready for this patient.  Notified Tiffany,RN and Shaniqua,RN.

## 2023-12-20 NOTE — Assessment & Plan Note (Addendum)
-   Patient recently started new chemotherapy Trodelvy at [facility] with doses on 12/11 and 12/18.  Starting her second dose she already had notable decline in both her hemoglobin and WBC. - Hgb 6.8 on presentation; Hgb 9.3 (12/18), 10.1 (12/6)- will transfuse 1u pRBC and follow post H/H with goal hgb >7 - WBC 0.3 on presentation; WBC 3.1 (12/18), 9.8 (12/6)  -Plt of 149 on presenation -continue to monitor while on IV antibiotics.  -Pt will need discuss with [facility] oncology regarding chemotherapy regimen going forward due to significant intolerance. She would like assistance to re-pursue transfer to [facility]. Her primary oncology is Durene Romans NP [phone]

## 2023-12-20 NOTE — ED Notes (Signed)
This RN assumed care of pt at 1900, upon arrival I noted pt's blood transfusion had already been started and the 15 minute post-blood start check was already completed. After looking through the chart I noted that a blood consent had not been obtained by day shift nurse prior to starting transfusion. Press photographer, [place] notified.

## 2023-12-20 NOTE — Assessment & Plan Note (Signed)
-  secondary to hypovolemia  -Has received close to 3L of fluid bolus in ED with Na raising from 115 to 120 -will continue gentle IV NS with goal of >8 meq correction in 24 hours

## 2023-12-20 NOTE — ED Provider Notes (Signed)
[place] EMERGENCY DEPARTMENT AT [facility] Provider Note   CSN: [id number] Arrival date & time: 12/20/23  1255     History  Chief Complaint  Patient presents with   Weakness    Barbara Nicholson is a 47 y.o. female with past medical history significant for ulcerative colitis, urothelial cancer who presents with concern for weakness, dizziness, diarrhea, shortness of breath, fever and chills after recent chemotherapy treatments.  Patient is on new chemotherapy called Sacituzumab, she had her first dose on the 11th, second dose on the 18th.  She had some neutropenia after the first dose.  They have not started her on any kind of Neupogen or other medication because of insurance approval at this time.  She reports temperature of 101.5 earlier today and took Tylenol prior to arrival.   Weakness      Home Medications Prior to Admission medications   Medication Sig Start Date End Date Taking? Authorizing Provider  HYDROcodone-acetaminophen (NORCO/VICODIN) 5-325 MG tablet Take 1 tablet by mouth every 6 (six) hours as needed for severe pain. Patient not taking: Reported on 10/25/2022 10/05/22   Mannie Stabile, PA-C  HYDROmorphone (DILAUDID) 4 MG tablet Take one to two every 4 hours as needed. 10/24/22   Benjiman Core, MD  ibuprofen (ADVIL) 600 MG tablet Take 1 tablet (600 mg total) by mouth every 6 (six) hours. 04/11/22   Marcelle Overlie, MD  ondansetron (ZOFRAN-ODT) 4 MG disintegrating tablet Take 1 tablet (4 mg total) by mouth every 8 (eight) hours as needed for nausea or vomiting. 10/05/22   Mannie Stabile, PA-C  oxyCODONE (OXY IR/ROXICODONE) 5 MG immediate release tablet Take 1-2 tablets (5-10 mg total) by mouth every 4 (four) hours as needed for moderate pain. 10/18/22   Benjiman Core, MD  oxyCODONE (ROXICODONE) 5 MG immediate release tablet Take 1 tablet (5 mg total) by mouth every 4 (four) hours as needed for severe pain. Patient not taking: Reported on  10/25/2022 10/17/22   Tanda Rockers A, DO  sulfamethoxazole-trimethoprim (BACTRIM DS) 800-160 MG tablet Take 1 tablet by mouth 2 (two) times daily. 10/07/22   [provider]  traZODone (DESYREL) 50 MG tablet Take 0.5-1 tablets (25-50 mg total) by mouth at bedtime as needed for sleep. 09/17/18   Copland, Karleen Hampshire, MD      Allergies    Zosyn [piperacillin-tazobactam in dex]    Review of Systems   Review of Systems  Neurological:  Positive for weakness.  All other systems reviewed and are negative.   Physical Exam Updated Vital Signs BP 93/63   Pulse 78   Temp 97.9 F (36.6 C) (Oral)   Resp 13   Ht 5\' 4"  (1.626 m)   Wt 54.4 kg   LMP 03/16/2022 (Approximate)   SpO2 99%   BMI 20.60 kg/m  Physical Exam Vitals and nursing note reviewed.  Constitutional:      General: She is not in acute distress.    Appearance: Normal appearance. She is ill-appearing.     Comments: Chronically ill-appearing, pale, versus mild jaundice  HENT:     Head: Normocephalic and atraumatic.  Eyes:     General:        Right eye: No discharge.        Left eye: No discharge.  Cardiovascular:     Rate and Rhythm: Normal rate and regular rhythm.     Heart sounds: No murmur heard.    No friction rub. No gallop.  Pulmonary:  Effort: Pulmonary effort is normal.     Breath sounds: Normal breath sounds.  Abdominal:     General: Bowel sounds are normal.     Palpations: Abdomen is soft.     Comments: Diffusely tender to palpation throughout the lower abdomen, no rebound, rigidity, guarding  Skin:    General: Skin is warm and dry.     Capillary Refill: Capillary refill takes less than 2 seconds.  Neurological:     Mental Status: She is alert and oriented to person, place, and time.  Psychiatric:        Mood and Affect: Mood normal.        Behavior: Behavior normal.     ED Results / Procedures / Treatments   Labs (all labs ordered are listed, but only abnormal results are displayed) Labs  Reviewed  CULTURE, BLOOD (ROUTINE X 2)  CULTURE, BLOOD (ROUTINE X 2)  RESP PANEL BY RT-PCR (RSV, FLU A&B, COVID)  [id number]  CBC WITH DIFFERENTIAL/PLATELET  COMPREHENSIVE METABOLIC PANEL  LIPASE, BLOOD  URINALYSIS, ROUTINE W REFLEX MICROSCOPIC  PROTIME-INR  APTT  I-STAT CG4 LACTIC ACID, ED  I-STAT CG4 LACTIC ACID, ED  I-STAT CG4 LACTIC ACID, ED    EKG EKG Interpretation Date/Time:  Friday December 20 2023 13:04:57 EST Ventricular Rate:  80 PR Interval:  135 QRS Duration:  120 QT Interval:  403 QTC Calculation: 465 R Axis:   38  Text Interpretation: Sinus rhythm Nonspecific intraventricular conduction delay Borderline T abnormalities, anterior leads Confirmed by Lorre Nick ([id number]) on 12/20/2023 2:03:59 PM  Radiology No results found.  Procedures Procedures    Medications Ordered in ED Medications  lactated ringers infusion (has no administration in time range)  ceFEPIme (MAXIPIME) 2 g in sodium chloride 0.9 % 100 mL IVPB (2 g Intravenous New Bag/Given 12/20/23 1448)  metroNIDAZOLE (FLAGYL) IVPB 500 mg (500 mg Intravenous New Bag/Given 12/20/23 1449)  vancomycin (VANCOCIN) IVPB 1000 mg/200 mL premix (has no administration in time range)  lactated ringers bolus 1,000 mL (1,000 mLs Intravenous New Bag/Given 12/20/23 1455)    And  lactated ringers bolus 500 mL (500 mLs Intravenous New Bag/Given 12/20/23 1456)    And  lactated ringers bolus 250 mL (has no administration in time range)  morphine (PF) 4 MG/ML injection 4 mg (4 mg Intravenous Given 12/20/23 1445)    ED Course/ Medical Decision Making/ A&P                                 Medical Decision Making Amount and/or Complexity of Data Reviewed Labs: ordered. Radiology: ordered.  Risk Prescription drug management.   This patient is a 47 y.o. female who presents to the ED for concern of weakness, fever, this involves an extensive number of treatment options, and is a complaint that carries with it a high  risk of complications and morbidity. The emergent differential diagnosis prior to evaluation includes, but is not limited to,  CVA, spinal cord injury, ACS, arrhythmia, syncope, orthostatic hypotension, sepsis, hypoglycemia, hypoxia, electrolyte disturbance, endocrine disorder, anemia, environmental exposure, polypharmacy, given her history of cancer, recent chemo hide differential diagnosis placed on neutropenic fever, versus other undifferentiated sepsis. This is not an exhaustive differential.   Past Medical History / Co-morbidities / Social History: UC, renal cancer, not uroepithelial cancer, anemia, B12 deficiency  Additional history: Chart reviewed. Pertinent results include: extensively reviewed oncology notes at [facility] where patient is primarily seen  Physical Exam:  Physical exam performed. The pertinent findings include: Chronically ill-appearing, borderline soft blood pressure, 95/61, diffusely tender to palpation in the lower abdomen but no rebound, rigidity, guarding.  Lab work is pending at this time.   Imaging Studies: I ordered imaging studies including plain film radiograph of the chest, CT abdomen pelvis with contrast, radiology is pending at time of handoff.   Cardiac Monitoring:  The patient was maintained on a cardiac monitor.  My attending physician Dr. Freida Busman viewed and interpreted the cardiac monitored which showed an underlying rhythm of: Normal sinus rhythm, nonspecific T wave abnormalities in anterior leads. I agree with this interpretation.   Medications: I ordered medication including fluid bolus, broad-spectrum antibiotics for undifferentiated sepsis and cancer patient with neutropenia  3:07 PM Care of Barbara Nicholson transferred to [facility] and Dr. Rhae Hammock at the end of my shift as the patient will require reassessment once labs/imaging have resulted. Patient presentation, ED course, and plan of care discussed with review of all pertinent labs and imaging. Please see his/her  note for further details regarding further ED course and disposition. Plan at time of handoff is reassess after lab work, imaging, patient will likely need admission for sepsis and cancer patient. This may be altered or completely changed at the discretion of the oncoming team pending results of further workup.   Final Clinical Impression(s) / ED Diagnoses Final diagnoses:  None    Rx / DC Orders ED Discharge Orders     None         Olene Floss, PA-C 12/20/23 1507

## 2023-12-20 NOTE — Assessment & Plan Note (Signed)
-  this has resolved following IV fluid administration

## 2023-12-21 DIAGNOSIS — E871 Hypo-osmolality and hyponatremia: Secondary | ICD-10-CM | POA: Diagnosis not present

## 2023-12-21 DIAGNOSIS — D709 Neutropenia, unspecified: Secondary | ICD-10-CM | POA: Diagnosis not present

## 2023-12-21 DIAGNOSIS — E876 Hypokalemia: Secondary | ICD-10-CM | POA: Diagnosis not present

## 2023-12-21 DIAGNOSIS — C689 Malignant neoplasm of urinary organ, unspecified: Secondary | ICD-10-CM | POA: Diagnosis not present

## 2023-12-21 LAB — BLOOD CULTURE ID PANEL (REFLEXED) - BCID2

## 2023-12-21 LAB — CBC WITH DIFFERENTIAL/PLATELET
Abs Immature Granulocytes: 0.26 10*3/uL — ABNORMAL HIGH (ref 0.00–0.07)
Basophils Absolute: 0 10*3/uL (ref 0.0–0.1)
Basophils Relative: 1 %
Eosinophils Absolute: 0.1 10*3/uL (ref 0.0–0.5)
Eosinophils Relative: 7 %
HCT: 26 % — ABNORMAL LOW (ref 36.0–46.0)
Hemoglobin: 8.8 g/dL — ABNORMAL LOW (ref 12.0–15.0)
Immature Granulocytes: 21 %
Lymphocytes Relative: 12 %
Lymphs Abs: 0.2 10*3/uL — ABNORMAL LOW (ref 0.7–4.0)
MCH: 29 pg (ref 26.0–34.0)
MCHC: 33.8 g/dL (ref 30.0–36.0)
MCV: 85.8 fL (ref 80.0–100.0)
Monocytes Absolute: 0.1 10*3/uL (ref 0.1–1.0)
Monocytes Relative: 11 %
Neutro Abs: 0.6 10*3/uL — ABNORMAL LOW (ref 1.7–7.7)
Neutrophils Relative %: 48 %
Platelets: 167 10*3/uL (ref 150–400)
RBC: 3.03 MIL/uL — ABNORMAL LOW (ref 3.87–5.11)
RDW: 14.2 % (ref 11.5–15.5)
WBC: 1.2 10*3/uL — CL (ref 4.0–10.5)
nRBC: 0 % (ref 0.0–0.2)

## 2023-12-21 LAB — COMPREHENSIVE METABOLIC PANEL
ALT: 13 U/L (ref 0–44)
AST: <5 U/L — ABNORMAL LOW (ref 15–41)
Albumin: 1.7 g/dL — ABNORMAL LOW (ref 3.5–5.0)
Alkaline Phosphatase: 74 U/L (ref 38–126)
Anion gap: 8 (ref 5–15)
BUN: 20 mg/dL (ref 6–20)
CO2: 21 mmol/L — ABNORMAL LOW (ref 22–32)
Calcium: 7.4 mg/dL — ABNORMAL LOW (ref 8.9–10.3)
Chloride: 91 mmol/L — ABNORMAL LOW (ref 98–111)
Creatinine, Ser: 0.81 mg/dL (ref 0.44–1.00)
GFR, Estimated: >60 mL/min (ref >60)
Glucose, Bld: 124 mg/dL — ABNORMAL HIGH (ref 70–99)
Potassium: 2.9 mmol/L — ABNORMAL LOW (ref 3.5–5.1)
Sodium: 120 mmol/L — ABNORMAL LOW (ref 135–145)
Total Bilirubin: 1.4 mg/dL — ABNORMAL HIGH (ref <1.2)
Total Protein: 4.6 g/dL — ABNORMAL LOW (ref 6.5–8.1)

## 2023-12-21 LAB — CBC
HCT: 20.1 % — ABNORMAL LOW (ref 36.0–46.0)
Hemoglobin: 7.3 g/dL — ABNORMAL LOW (ref 12.0–15.0)
MCH: 30.5 pg (ref 26.0–34.0)
MCHC: 36.3 g/dL — ABNORMAL HIGH (ref 30.0–36.0)
MCV: 84.1 fL (ref 80.0–100.0)
Platelets: 129 10*3/uL — ABNORMAL LOW (ref 150–400)
RBC: 2.39 MIL/uL — ABNORMAL LOW (ref 3.87–5.11)
RDW: 13.8 % (ref 11.5–15.5)
WBC: 0.6 10*3/uL — CL (ref 4.0–10.5)
nRBC: 0 % (ref 0.0–0.2)

## 2023-12-21 LAB — RENAL FUNCTION PANEL
Albumin: 1.8 g/dL — ABNORMAL LOW (ref 3.5–5.0)
Anion gap: 8 (ref 5–15)
BUN: 14 mg/dL (ref 6–20)
CO2: 22 mmol/L (ref 22–32)
Calcium: 7.7 mg/dL — ABNORMAL LOW (ref 8.9–10.3)
Chloride: 99 mmol/L (ref 98–111)
Creatinine, Ser: 0.7 mg/dL (ref 0.44–1.00)
GFR, Estimated: >60 mL/min (ref >60)
Glucose, Bld: 109 mg/dL — ABNORMAL HIGH (ref 70–99)
Phosphorus: 1.5 mg/dL — ABNORMAL LOW (ref 2.5–4.6)
Potassium: 4.3 mmol/L (ref 3.5–5.1)
Sodium: 129 mmol/L — ABNORMAL LOW (ref 135–145)

## 2023-12-21 LAB — PREPARE RBC (CROSSMATCH)

## 2023-12-21 LAB — HEMOGLOBIN AND HEMATOCRIT, BLOOD
HCT: 20.9 % — ABNORMAL LOW (ref 36.0–46.0)
Hemoglobin: 7.5 g/dL — ABNORMAL LOW (ref 12.0–15.0)

## 2023-12-21 LAB — MAGNESIUM: Magnesium: 2 mg/dL (ref 1.7–2.4)

## 2023-12-21 MED ORDER — SIMETHICONE 40 MG/0.6ML PO SUSP
80.0000 mg | Freq: Four times a day (QID) | ORAL | Status: DC
  Administered 2023-12-21 – 2023-12-25 (×11): 80 mg via ORAL
  Filled 2023-12-21 (×18): qty 1.2

## 2023-12-21 MED ORDER — ENSURE ENLIVE PO LIQD
237.0000 mL | Freq: Two times a day (BID) | ORAL | Status: DC
  Administered 2023-12-21 – 2023-12-22 (×4): 237 mL via ORAL

## 2023-12-21 MED ORDER — PANTOPRAZOLE SODIUM 40 MG PO TBEC
40.0000 mg | DELAYED_RELEASE_TABLET | Freq: Every day | ORAL | Status: DC
  Administered 2023-12-21 – 2023-12-25 (×5): 40 mg via ORAL
  Filled 2023-12-21 (×5): qty 1

## 2023-12-21 MED ORDER — DIPHENHYDRAMINE HCL 25 MG PO CAPS: 25.0000 mg | ORAL_CAPSULE | Freq: Three times a day (TID) | ORAL | Status: DC | PRN

## 2023-12-21 MED ORDER — SODIUM CHLORIDE 1 G PO TABS: 1.0000 g | ORAL_TABLET | Freq: Two times a day (BID) | ORAL | Status: DC

## 2023-12-21 MED ORDER — OXYCODONE HCL 5 MG PO TABS
30.0000 mg | ORAL_TABLET | Freq: Four times a day (QID) | ORAL | Status: DC | PRN
  Administered 2023-12-21 – 2023-12-25 (×3): 30 mg via ORAL
  Filled 2023-12-21 (×4): qty 6

## 2023-12-21 MED ORDER — POTASSIUM CHLORIDE CRYS ER 20 MEQ PO TBCR
40.0000 meq | EXTENDED_RELEASE_TABLET | ORAL | Status: AC
  Administered 2023-12-21: 40 meq via ORAL
  Filled 2023-12-21: qty 2

## 2023-12-21 MED ORDER — HYDROMORPHONE HCL 1 MG/ML IJ SOLN: 0.5000 mg | INTRAMUSCULAR | Status: DC | PRN

## 2023-12-21 MED ORDER — SIMETHICONE 80 MG PO CHEW
80.0000 mg | CHEWABLE_TABLET | Freq: Four times a day (QID) | ORAL | Status: DC | PRN
  Administered 2023-12-21 – 2023-12-22 (×3): 80 mg via ORAL
  Filled 2023-12-21 (×2): qty 1

## 2023-12-21 MED ORDER — LORAZEPAM 1 MG PO TABS
1.0000 mg | ORAL_TABLET | Freq: Three times a day (TID) | ORAL | Status: DC
  Administered 2023-12-21 – 2023-12-25 (×12): 1 mg via ORAL
  Filled 2023-12-21 (×12): qty 1

## 2023-12-21 MED ORDER — POTASSIUM CHLORIDE CRYS ER 20 MEQ PO TBCR
40.0000 meq | EXTENDED_RELEASE_TABLET | Freq: Four times a day (QID) | ORAL | Status: DC
  Administered 2023-12-21: 40 meq via ORAL
  Filled 2023-12-21: qty 2

## 2023-12-21 MED ORDER — ORAL CARE MOUTH RINSE: 15.0000 mL | OROMUCOSAL | Status: DC | PRN

## 2023-12-21 MED ORDER — MORPHINE SULFATE (PF) 4 MG/ML IV SOLN
4.0000 mg | INTRAVENOUS | Status: DC | PRN
  Administered 2023-12-21 – 2023-12-24 (×10): 4 mg via INTRAVENOUS
  Filled 2023-12-21 (×10): qty 1

## 2023-12-21 MED ORDER — SODIUM CHLORIDE 0.9% IV SOLUTION
Freq: Once | INTRAVENOUS | Status: AC
Start: 2023-12-21 — End: 2023-12-21

## 2023-12-21 MED ORDER — SODIUM CHLORIDE 0.9 % IV BOLUS
250.0000 mL | Freq: Once | INTRAVENOUS | Status: AC
  Administered 2023-12-21: 250 mL via INTRAVENOUS

## 2023-12-21 MED ORDER — POTASSIUM CHLORIDE IN NACL 40-0.9 MEQ/L-% IV SOLN
INTRAVENOUS | Status: DC
  Filled 2023-12-21: qty 1000

## 2023-12-21 MED ORDER — OXYCODONE HCL 5 MG PO TABS: 30.0000 mg | ORAL_TABLET | ORAL | Status: DC | PRN

## 2023-12-21 NOTE — Progress Notes (Signed)
PHARMACY - PHYSICIAN COMMUNICATION CRITICAL VALUE ALERT - BLOOD CULTURE IDENTIFICATION (BCID)  Barbara Nicholson is an 47 y.o. female who presented to [facility] on 12/20/2023 with a chief complaint of  fever, weakness, diarrhea, chills, rigors and dyspnea with exertion, and admitted with neutropenic fever and symptomatic anemia.   Assessment: Strep bacteremia  Name of physician (or Provider) ContactedAlanda Slim  Current antibiotics: Rocephin/Flagyl  Changes to prescribed antibiotics recommended:  Patient is on recommended antibiotics - No changes needed  Results for orders placed or performed during the hospital encounter of 12/20/23  Blood Culture ID Panel (Reflexed) (Collected: 12/20/2023  2:45 PM)  Result Value Ref Range   Enterococcus faecalis NOT DETECTED NOT DETECTED   Enterococcus Faecium NOT DETECTED NOT DETECTED   Listeria monocytogenes NOT DETECTED NOT DETECTED   Staphylococcus species NOT DETECTED NOT DETECTED   Staphylococcus aureus (BCID) NOT DETECTED NOT DETECTED   Staphylococcus epidermidis NOT DETECTED NOT DETECTED   Staphylococcus lugdunensis NOT DETECTED NOT DETECTED   Streptococcus species DETECTED (A) NOT DETECTED   Streptococcus agalactiae NOT DETECTED NOT DETECTED   Streptococcus pneumoniae NOT DETECTED NOT DETECTED   Streptococcus pyogenes NOT DETECTED NOT DETECTED   A.calcoaceticus-baumannii NOT DETECTED NOT DETECTED   Bacteroides fragilis NOT DETECTED NOT DETECTED   Enterobacterales NOT DETECTED NOT DETECTED   Enterobacter cloacae complex NOT DETECTED NOT DETECTED   Escherichia coli NOT DETECTED NOT DETECTED   Klebsiella aerogenes NOT DETECTED NOT DETECTED   Klebsiella oxytoca NOT DETECTED NOT DETECTED   Klebsiella pneumoniae NOT DETECTED NOT DETECTED   Proteus species NOT DETECTED NOT DETECTED   Salmonella species NOT DETECTED NOT DETECTED   Serratia marcescens NOT DETECTED NOT DETECTED   Haemophilus influenzae NOT DETECTED NOT DETECTED   Neisseria  meningitidis NOT DETECTED NOT DETECTED   Pseudomonas aeruginosa NOT DETECTED NOT DETECTED   Stenotrophomonas maltophilia NOT DETECTED NOT DETECTED   Candida albicans NOT DETECTED NOT DETECTED   Candida auris NOT DETECTED NOT DETECTED   Candida glabrata NOT DETECTED NOT DETECTED   Candida krusei NOT DETECTED NOT DETECTED   Candida parapsilosis NOT DETECTED NOT DETECTED   Candida tropicalis NOT DETECTED NOT DETECTED   Cryptococcus neoformans/gattii NOT DETECTED NOT DETECTED   [name], PharmD, BCPS Clinical Staff Pharmacist [email or web address] Pasty Spillers 12/21/2023  12:25 PM
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
## 2023-12-21 NOTE — Plan of Care (Signed)
  Problem: Clinical Measurements: Goal: Ability to maintain clinical measurements within normal limits will improve Outcome: Not Progressing Goal: Will remain free from infection Outcome: Not Progressing Goal: Diagnostic test results will improve Outcome: Not Progressing   Problem: Activity: Goal: Risk for activity intolerance will decrease Outcome: Not Progressing   Problem: Elimination: Goal: Will not experience complications related to bowel motility Outcome: Not Progressing   Problem: Pain Management: Goal: General experience of comfort will improve Outcome: Not Progressing

## 2023-12-22 ENCOUNTER — Inpatient Hospital Stay (HOSPITAL_COMMUNITY): Payer: No Typology Code available for payment source

## 2023-12-22 DIAGNOSIS — B954 Other streptococcus as the cause of diseases classified elsewhere: Secondary | ICD-10-CM | POA: Diagnosis not present

## 2023-12-22 DIAGNOSIS — R7881 Bacteremia: Secondary | ICD-10-CM | POA: Diagnosis not present

## 2023-12-22 DIAGNOSIS — E871 Hypo-osmolality and hyponatremia: Secondary | ICD-10-CM | POA: Diagnosis not present

## 2023-12-22 DIAGNOSIS — R5081 Fever presenting with conditions classified elsewhere: Secondary | ICD-10-CM | POA: Diagnosis not present

## 2023-12-22 DIAGNOSIS — D709 Neutropenia, unspecified: Secondary | ICD-10-CM | POA: Diagnosis not present

## 2023-12-22 DIAGNOSIS — C689 Malignant neoplasm of urinary organ, unspecified: Secondary | ICD-10-CM | POA: Diagnosis not present

## 2023-12-22 DIAGNOSIS — E876 Hypokalemia: Secondary | ICD-10-CM | POA: Diagnosis not present

## 2023-12-22 LAB — ECHOCARDIOGRAM COMPLETE
Area-P 1/2: 4.06 cm2
Calc EF: 69.7 %
Height: 64 in
S' Lateral: 3.2 cm
Single Plane A2C EF: 71.4 %
Single Plane A4C EF: 69 %
Weight: 2186.96 [oz_av]

## 2023-12-22 LAB — COMPREHENSIVE METABOLIC PANEL
ALT: 9 U/L (ref 0–44)
AST: <5 U/L — ABNORMAL LOW (ref 15–41)
Albumin: 1.8 g/dL — ABNORMAL LOW (ref 3.5–5.0)
Alkaline Phosphatase: 64 U/L (ref 38–126)
Anion gap: 7 (ref 5–15)
BUN: 13 mg/dL (ref 6–20)
CO2: 21 mmol/L — ABNORMAL LOW (ref 22–32)
Calcium: 7.4 mg/dL — ABNORMAL LOW (ref 8.9–10.3)
Chloride: 94 mmol/L — ABNORMAL LOW (ref 98–111)
Creatinine, Ser: 0.5 mg/dL (ref 0.44–1.00)
GFR, Estimated: >60 mL/min (ref >60)
Glucose, Bld: 97 mg/dL (ref 70–99)
Potassium: 3.9 mmol/L (ref 3.5–5.1)
Sodium: 122 mmol/L — ABNORMAL LOW (ref 135–145)
Total Bilirubin: 0.8 mg/dL (ref <1.2)
Total Protein: 4.4 g/dL — ABNORMAL LOW (ref 6.5–8.1)

## 2023-12-22 LAB — CBC WITH DIFFERENTIAL/PLATELET
Abs Immature Granulocytes: 0.15 10*3/uL — ABNORMAL HIGH (ref 0.00–0.07)
Basophils Absolute: 0 10*3/uL (ref 0.0–0.1)
Basophils Relative: 1 %
Eosinophils Absolute: 0.2 10*3/uL (ref 0.0–0.5)
Eosinophils Relative: 8 %
HCT: 23 % — ABNORMAL LOW (ref 36.0–46.0)
Hemoglobin: 8.3 g/dL — ABNORMAL LOW (ref 12.0–15.0)
Immature Granulocytes: 8 %
Lymphocytes Relative: 8 %
Lymphs Abs: 0.2 10*3/uL — ABNORMAL LOW (ref 0.7–4.0)
MCH: 30.9 pg (ref 26.0–34.0)
MCHC: 36.1 g/dL — ABNORMAL HIGH (ref 30.0–36.0)
MCV: 85.5 fL (ref 80.0–100.0)
Monocytes Absolute: 0.2 10*3/uL (ref 0.1–1.0)
Monocytes Relative: 12 %
Neutro Abs: 1.2 10*3/uL — ABNORMAL LOW (ref 1.7–7.7)
Neutrophils Relative %: 63 %
Platelets: 138 10*3/uL — ABNORMAL LOW (ref 150–400)
RBC: 2.69 MIL/uL — ABNORMAL LOW (ref 3.87–5.11)
RDW: 14.6 % (ref 11.5–15.5)
WBC: 1.9 10*3/uL — ABNORMAL LOW (ref 4.0–10.5)
nRBC: 0 % (ref 0.0–0.2)

## 2023-12-22 LAB — MAGNESIUM: Magnesium: 1.7 mg/dL (ref 1.7–2.4)

## 2023-12-22 MED ORDER — MAGNESIUM SULFATE 2 GM/50ML IV SOLN
2.0000 g | Freq: Once | INTRAVENOUS | Status: AC
  Administered 2023-12-22: 2 g via INTRAVENOUS
  Filled 2023-12-22: qty 50

## 2023-12-22 MED ORDER — POTASSIUM CHLORIDE IN NACL 20-0.9 MEQ/L-% IV SOLN
INTRAVENOUS | Status: AC
Start: 2023-12-22 — End: 2023-12-23
  Filled 2023-12-22 (×2): qty 1000

## 2023-12-22 NOTE — Consult Note (Signed)
Barbara Nicholson  October 30, 1976 [id number]  CARE TEAM:  PCP: Dois Davenport, MD  Outpatient Care Team: Patient Care Team: Dois Davenport, MD as PCP - General (Family Medicine) Lottie Dawson, MD as Referring Physician (Urology) Candee Furbish, MD as Referring Physician (Radiation Oncology)  Inpatient Treatment Team: Treatment Team:  Almon Hercules, MD Merlyn Albert, MD Temple Pacini, RN Fredric Dine, NT Bess Kinds, RN Anterhaus, Ervin Knack, LCSW Loma Sender, [facility], Md, MD   This patient is a 47 y.o.female who presents today for surgical evaluation at the request of Dr Alanda Slim.   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  Found to have left uroepithelial cancer involving the left renal pelvis requiring a radical robotic left nephrectomy at [facility] May 2024.  Unfortunately developed metastatic disease to lung, peritoneum and retroperitoneum.  Switch to different chemotherapy regimen including Keytruda.  Has had radiation therapy as well.  Switch to different chemotherapy regimen Trodelvy.  Last dose 12/18.  Had high-volume watery diarrhea.  Felt possibly due to chemotherapy.  Crampy abdominal pain.  Had chills and rigors.  Came to emergency department.  Admitted.  Transfer not available [facility] due to full beds and on diversion.  At [facility].  Had CAT scan which shows thickening of colon specially on ascending colon with nodularity in left retroperitoneum in bed of prior nephrectomy and near sigmoid and descending colon.  Radiology concern for possible perforation or at least met intimately involved with colon.  Given abnormality, patient and family wondering if surgery could be done to remove these nodules.  Patient notes still having diarrhea but abdominal pain is less.  Has been tolerating clears and recently advance  to soft diet without nausea or vomiting.  Family in room but patient wished to see me and discuss with me only.  Patient wonders if there is an issue with the portacatheter as well  Assessment  Barbara Nicholson  47 y.o. female       Problem List:  Principal Problem:   Neutropenic fever (HCC) Active Problems:   Kidney cancer, primary, with metastasis from kidney to other site, left (HCC)   Colitis   Pancytopenia (HCC)   Hyponatremia   Hypokalemia   Urothelial cancer (HCC)   Urothelial cancer status post radical left nephro ureterectomy with early retroperitoneal, intraperitoneal, distant metastatic disease  Concern for colon thickening and nodules for possible colitis/mets  Plan:  Patient does not have an acute abdomen.  In my review of the CAT scan, I see areas of nodules showing concern for intraperitoneal carcinomatosis and retroperitoneal masses in the left retroperitoneal at site of L nephrectomy, pelvis w rectosigmoid, and left lower quadrant at the descending/sigmoid junction.  I see no evidence of any perforation.  I see thickening that raises concern for more colitis.  I believe she has a history of ulcerative colitis so that is hard to read as well.  I have no films for comparison.  I see no hard indication for emergency surgery at this time and I would be very hesitant to consider that in this severely immunosuppressed neutropenic patient with carcinomatosis and diffuse metastatic disease.  Even she had evidence of perforation, I treated like a diverticulitis with antibiotics and bowel rest which has been occurring anyway.  Trying to do a Hartmann resection on her would be extremely high  risk and most likely would delaying care and not improve survival  She can p.o. as tolerated.  Agree with gastrointestinal workup to rule out C. difficile or other infectious gastroenteritis/colitis  Normally I would do piperacillin/tazobactam for any intra-abdominal concerns, but given her  allergy would favor cefepime/metronidazole per surgery protocol.  Management of a urothelial cancer per urology.  I assume call urology [facility] they are going to defer to [facility] urology/oncology/radiation oncology and have been managing this patient for the past year and switching to a third line chemotherapy regimen  I worry about the rapid progression.  I think operating would have high risk of dehiscence and enterocutaneous/colocutaneous fistulas and delay any treatment or care and just because suffering at this point.  There is a window to put a palliative G-tube by IR should she develop obstruction which she has no evidence of at this time.  -VTE prophylaxis- SCDs, etc  -mobilize as tolerated to help recovery  Surgery will follow peripherally.  Strongly recommend resuming communication with main [facility] urology cancer team when they are available for current and long-term planning  I reviewed nursing notes, ED provider notes, hospitalist notes, last 24 h vitals and pain scores, last 48 h intake and output, last 24 h labs and trends, and last 24 h imaging results. I have reviewed this patient's available data, including medical history, events of note, test results, etc as part of my evaluation.  A significant portion of that time was spent in counseling.  Care during the described time interval was provided by me.  This care required high  level of medical decision making.  12/22/2023  Ardeth Sportsman, MD, FACS, MASCRS Esophageal, Gastrointestinal & Colorectal Surgery Robotic and Minimally Invasive Surgery  [facility] [address], [address] [place], [place] [address] [phone] Fax [phone] Main  CONTACT INFORMATION: Weekday (9AM-5PM): Call [facility] main office at [phone] Weeknight (5PM-9AM) or Weekend/Holiday: Check EPIC "Web Links" tab & use "AMION" (password " [email or web address]") for General Surgery [facility]  coverage  Please, DO NOT use SecureChat  (it is not reliable communication to reach operating surgeons & will lead to a delay in care).   Epic staff messaging available for outptient concerns needing 1-2 business day response.      12/22/2023      Past Medical History:  Diagnosis Date   AMA (advanced maternal age) multigravida 35+    Anemia    hx of anemia due to fibroids   B12 deficiency 2015   Concussion with < 1 hr loss of consciousness 09/10/2018   Patient fell at home and hit her head. HA, confusion, amnesia around the time of the accident.   Generalized anxiety disorder 2021   work-related, better as of 04/06/22   Heart murmur    as a baby   Hx of ulcerative colitis    Malignant neoplasm (HCC) 2023   kidney, lungs - On chemotherapy   Syncope 03/14/2014   syncope after epidsode of explosive diarrhea   Ulcerative colitis (HCC)    hx of left sided colitis 05/23/18   Wears contact lenses    Wears glasses     Past Surgical History:  Procedure Laterality Date   ABDOMINAL HYSTERECTOMY     CESAREAN SECTION N/A 10/13/2013   Procedure: Primary Cesarean Section Delivery Baby  Girl  @ 0122, Apgars 9/9   ;  Surgeon: Jeani Hawking, MD;  Location: [facility];  Service: Obstetrics;  Laterality:  N/A;   COLONOSCOPY  2020   HYSTERECTOMY ABDOMINAL WITH SALPINGECTOMY N/A 04/10/2022   Procedure: HYSTERECTOMY ABDOMINAL WITH BILATERAL SALPINGECTOMY;  Surgeon: Marcelle Overlie, MD;  Location[facility];  Service: Gynecology;  Laterality: N/A;   UTERINE SEPTUM RESECTION     around 2006   WISDOM TOOTH EXTRACTION  2002    Social History   Socioeconomic History   Marital status: Married    Spouse name: Not on file   Number of children: Not on file   Years of education: Not on file   Highest education level: Not on file  Occupational History   Not on file  Tobacco Use   Smoking status: Never   Smokeless tobacco: Never  Vaping Use   Vaping status: Never Used   Substance and Sexual Activity   Alcohol use: Yes    Comment: Socially    Drug use: No   Sexual activity: Yes    Comment: husband vasectomy  Other Topics Concern   Not on file  Social History Narrative   Not on file   Social Drivers of Health   Financial Resource Strain: Low Risk  (01/28/2023)   Received [facility]   Overall Financial Resource Strain (CARDIA)    Difficulty of Paying Living Expenses: Not very hard  Food Insecurity: No Food Insecurity (12/21/2023)   Hunger Vital Sign    Worried About Running Out of Food in the Last Year: Never true    Ran Out of Food in the Last Year: Never true  Transportation Needs: No Transportation Needs (12/21/2023)   PRAPARE - Administrator, Civil Service (Medical): No    Lack of Transportation (Non-Medical): No  Physical Activity: Not on file  Stress: Not on file  Social Connections: Not on file  Intimate Partner Violence: Not At Risk (12/21/2023)   Humiliation, Afraid, Rape, and Kick questionnaire    Fear of Current or Ex-Partner: No    Emotionally Abused: No    Physically Abused: No    Sexually Abused: No    Family History  Problem Relation Age of Onset   Mesothelioma Mother     Current Facility-Administered Medications  Medication Dose Route Frequency Provider Last Rate Last Admin   0.9 % NaCl with KCl 20 mEq/ L  infusion   Intravenous Continuous Almon Hercules, MD 75 mL/hr at 12/22/23 1115 Infusion Verify at 12/22/23 1115   acetaminophen (TYLENOL) tablet 650 mg  650 mg Oral Q6H PRN Tu, Ching T, DO       cefTRIAXone (ROCEPHIN) 2 g in sodium chloride 0.9 % 100 mL IVPB  2 g Intravenous Q24H Tu, Ching T, DO   Stopped at 12/22/23 1610   Chlorhexidine Gluconate Cloth 2 % PADS 6 each  6 each Topical Daily Tu, Ching T, DO   6 each at 12/22/23 0700   diphenhydrAMINE (BENADRYL) capsule 25 mg  25 mg Oral Q8H PRN Gonfa, Taye T, MD       feeding supplement (ENSURE ENLIVE / ENSURE PLUS) liquid 237 mL  237  mL Oral BID BM Tu, Ching T, DO   237 mL at 12/22/23 0854   LORazepam (ATIVAN) tablet 1 mg  1 mg Oral TID Candelaria Stagers T, MD   1 mg at 12/22/23 0853   methadone (DOLOPHINE) tablet 5 mg  5 mg Oral Q8H Tu, Ching T, DO   5 mg at 12/22/23 0544   metroNIDAZOLE (FLAGYL) IVPB 500 mg  500 mg Intravenous Q12H  Benita Gutter T, DO   Stopped at 12/22/23 [phone]   morphine (PF) 4 MG/ML injection 4 mg  4 mg Intravenous Q3H PRN Almon Hercules, MD   4 mg at 12/22/23 9604   Oral care mouth rinse  15 mL Mouth Rinse PRN Tu, Ching T, DO       oxyCODONE (Oxy IR/ROXICODONE) immediate release tablet 30 mg  30 mg Oral Q6H PRN Candelaria Stagers T, MD   30 mg at 12/21/23 1558   pantoprazole (PROTONIX) EC tablet 40 mg  40 mg Oral Daily Candelaria Stagers T, MD   40 mg at 12/22/23 0853   simethicone (MYLICON) 40 MG/0.6ML suspension 80 mg  80 mg Oral QID Candelaria Stagers T, MD   80 mg at 12/22/23 0853   simethicone (MYLICON) chewable tablet 80 mg  80 mg Oral Q6H PRN Anthoney Harada, NP   80 mg at 12/21/23 1341   sodium chloride flush (NS) 0.9 % injection 10-40 mL  10-40 mL Intracatheter Q12H Tu, Ching T, DO   10 mL at 12/22/23 0855   sodium chloride flush (NS) 0.9 % injection 10-40 mL  10-40 mL Intracatheter PRN Tu, Ching T, DO         Allergies  Allergen Reactions   Piperacillin Sod-Tazobactam So Anaphylaxis and Other (See Comments)    First dose- swollen lips, scratchy throat   Zosyn [Piperacillin-Tazobactam In Dex] Anaphylaxis    ROS:   All other systems reviewed & are negative except per HPI or as noted below: Constitutional:  No fevers, chills, sweats.  Weight stable Eyes:  No vision changes, No discharge HENT:  No sore throats, nasal drainage Lymph: No neck swelling, No bruising easily Pulmonary:  No cough, productive sputum CV: No orthopnea, PND  Patient walks 1/4 mile without difficulty.  No exertional chest/neck/shoulder/arm pain.  GI: No personal nor family history of GI/colon cancer, inflammatory bowel disease, irritable bowel  syndrome, allergy such as Celiac Sprue, dietary/dairy problems, colitis, ulcers nor gastritis.  No recent sick contacts/gastroenteritis.  No travel outside the country.  No changes in diet.  Renal: Urothelial cancer per HPI no UTIs, No hematuria Genital:  No drainage, bleeding, masses Musculoskeletal: No severe joint pain.  Good ROM major joints Skin:  No sores or lesions Heme/Lymph:  No easy bleeding.  No swollen lymph nodes   BP 97/63 (BP Location: Right Arm)   Pulse 85   Temp 99.2 F (37.3 C) (Oral)   Resp 16   Ht 5\' 4"  (1.626 m)   Wt 62 kg   LMP 03/16/2022 (Approximate)   SpO2 97%   BMI 23.46 kg/m   Physical Exam:  Constitutional: Not cachectic.  Hygeine adequate.  Vitals signs as above.   Eyes: Pupils reactive, normal extraocular movements. Sclera nonicteric Neuro: CN II-XII intact.  No major focal sensory defects.  No major motor deficits. Lymph: No head/neck/groin lymphadenopathy Psych:  No severe agitation.  No severe anxiety.  Judgment & insight Adequate, Oriented x4, HENT: Normocephalic, Mucus membranes moist.  No thrush.   Neck: Supple, No tracheal deviation.  No obvious thyromegaly Chest: No pain to chest wall compression.  Good respiratory excursion.  No audible wheezing CV:  Pulses intact.  regular rhythm.  No major extremity edema  Abdomen:  Flat Hernia: Not present. Diastasis recti: Not present. Soft.   Nondistended.  Nontender.  No hepatomegaly.  No splenomegaly  Gen:  Inguinal hernia: Not present.  Inguinal lymph nodes: without lymphadenopathy.    Rectal: (Deferred)  Ext: No  obvious deformity or contracture.  Edema: Not present.  No cyanosis Skin: No major subcutaneous nodules.  Warm and dry Musculoskeletal: Severe joint rigidity not present.  No obvious clubbing.  No digital petechiae.     Results:   Labs: Results for orders placed or performed during the hospital encounter of 12/20/23 (from the past 48 hours)  CBC with Differential     Status:  Abnormal   Collection Time: 12/20/23  2:29 PM  Result Value Ref Range   WBC 0.3 (LL) 4.0 - 10.5 K/uL    Comment: This critical result has verified and been called to [facility] S., RN by Pattricia Boss on 12 27 2024 at 1546, and has been read back.    RBC 2.20 (L) 3.87 - 5.11 MIL/uL   Hemoglobin 6.8 (LL) 12.0 - 15.0 g/dL    Comment: This critical result has verified and been called to [facility] S., RN by Pattricia Boss on 12 27 2024 at 1546, and has been read back.    HCT 18.7 (L) 36.0 - 46.0 %   MCV 85.0 80.0 - 100.0 fL   MCH 30.9 26.0 - 34.0 pg   MCHC 36.4 (H) 30.0 - 36.0 g/dL   RDW 16.1 09.6 - 04.5 %   Platelets 149 (L) 150 - 400 K/uL   nRBC 0.0 0.0 - 0.2 %   Neutrophils Relative % 31 %   Neutro Abs 0.1 (LL) 1.7 - 7.7 K/uL    Comment: This critical result has verified and been called to [facility] S., RN by Pattricia Boss on 12 27 2024 at 1609, and has been read back.    Lymphocytes Relative 15 %   Lymphs Abs 0.1 (L) 0.7 - 4.0 K/uL   Monocytes Relative 27 %   Monocytes Absolute 0.1 0.1 - 1.0 K/uL   Eosinophils Relative 12 %   Eosinophils Absolute 0.0 0.0 - 0.5 K/uL   Basophils Relative 0 %   Basophils Absolute 0.0 0.0 - 0.1 K/uL   Immature Granulocytes 15 %   Abs Immature Granulocytes 0.05 0.00 - 0.07 K/uL    Comment: Performed at [facility], [address], [place], [place] [address]  Comprehensive metabolic panel     Status: Abnormal   Collection Time: 12/20/23  2:29 PM  Result Value Ref Range   Sodium 115 (LL) 135 - 145 mmol/L    Comment: CRITICAL RESULT CALLED TO, READ BACK BY AND VERIFIED WITH HOLLATZ, C RN AT 1549 12/20/23 TUCKER, J    Potassium 3.4 (L) 3.5 - 5.1 mmol/L   Chloride 82 (L) 98 - 111 mmol/L   CO2 22 22 - 32 mmol/L   Glucose, Bld 96 70 - 99 mg/dL    Comment: Glucose reference range applies only to samples taken after fasting for at least 8 hours.   BUN 20 6 - 20 mg/dL   Creatinine, Ser 1.91 0.44 - 1.00 mg/dL   Calcium 7.7 (L) 8.9 - 10.3 mg/dL    Total Protein 5.5 (L) 6.5 - 8.1 g/dL   Albumin 2.2 (L) 3.5 - 5.0 g/dL   AST 5 (L) 15 - 41 U/L   ALT 14 0 - 44 U/L   Alkaline Phosphatase 85 38 - 126 U/L   Total Bilirubin 1.7 (H) <1.2 mg/dL   GFR, Estimated >47 >82 mL/min    Comment: (NOTE) Calculated using the CKD-EPI Creatinine Equation (2021)    Anion gap 11 5 - 15    Comment: Performed at [facility],  [address], [place], [place] [address]  Lipase, blood     Status: None   Collection Time: 12/20/23  2:29 PM  Result Value Ref Range   Lipase 18 11 - 51 U/L    Comment: Performed at [facility], [address] [place], [place] [address]  Blood culture (routine x 2)     Status: None (Preliminary result)   Collection Time: 12/20/23  2:29 PM   Specimen: BLOOD LEFT ARM  Result Value Ref Range   Specimen Description      BLOOD LEFT ARM Performed at [facility], [address], [place], [place] [address]    Special Requests      BOTTLES DRAWN AEROBIC AND ANAEROBIC Blood Culture results may not be optimal due to an inadequate volume of blood received in culture bottles Performed at [facility], [address] [place], [place] [address]    Culture      NO GROWTH 2 DAYS Performed at [facility], [address], [place], [place] [address]    Report Status PENDING   Resp panel by RT-PCR (RSV, Flu A&B, Covid) Anterior Nasal Swab     Status: None   Collection Time: 12/20/23  2:29 PM   Specimen: Anterior Nasal Swab  Result Value Ref Range   SARS Coronavirus 2 by RT PCR NEGATIVE NEGATIVE    Comment: (NOTE) SARS-CoV-2 target nucleic acids are NOT DETECTED.  The SARS-CoV-2 RNA is generally detectable in upper respiratory specimens during the acute phase of infection. The lowest concentration of SARS-CoV-2 viral copies this assay can detect is 138 copies/mL. A negative result does not preclude SARS-Cov-2 infection and should not be used as the sole basis  for treatment or other patient management decisions. A negative result may occur with  improper specimen collection/handling, submission of specimen other than nasopharyngeal swab, presence of viral mutation(s) within the areas targeted by this assay, and inadequate number of viral copies(<138 copies/mL). A negative result must be combined with clinical observations, patient history, and epidemiological information. The expected result is Negative.  Fact Sheet for Patients:  BloggerCourse.com  Fact Sheet for Healthcare Providers:  SeriousBroker.it  This test is no t yet approved or cleared by the [place] FDA and  has been authorized for detection and/or diagnosis of SARS-CoV-2 by FDA under an Emergency Use Authorization (EUA). This EUA will remain  in effect (meaning this test can be used) for the duration of the COVID-19 declaration under Section 564(b)(1) of the Act, 21 U.S.C.section 360bbb-3(b)(1), unless the authorization is terminated  or revoked sooner.       Influenza A by PCR NEGATIVE NEGATIVE   Influenza B by PCR NEGATIVE NEGATIVE    Comment: (NOTE) The Xpert Xpress SARS-CoV-2/FLU/RSV plus assay is intended as an aid in the diagnosis of influenza from Nasopharyngeal swab specimens and should not be used as a sole basis for treatment. Nasal washings and aspirates are unacceptable for Xpert Xpress SARS-CoV-2/FLU/RSV testing.  Fact Sheet for Patients: BloggerCourse.com  Fact Sheet for Healthcare Providers: SeriousBroker.it  This test is not yet approved or cleared by the [place] FDA and has been authorized for detection and/or diagnosis of SARS-CoV-2 by FDA under an Emergency Use Authorization (EUA). This EUA will remain in effect (meaning this test can be used) for the duration of the COVID-19 declaration under Section 564(b)(1) of the Act, 21  U.S.C. section 360bbb-3(b)(1), unless the authorization is terminated or revoked.  Resp Syncytial Virus by PCR NEGATIVE NEGATIVE    Comment: (NOTE) Fact Sheet for Patients: BloggerCourse.com  Fact Sheet for Healthcare Providers: SeriousBroker.it  This test is not yet approved or cleared by the [place] FDA and has been authorized for detection and/or diagnosis of SARS-CoV-2 by FDA under an Emergency Use Authorization (EUA). This EUA will remain in effect (meaning this test can be used) for the duration of the COVID-19 declaration under Section 564(b)(1) of the Act, 21 U.S.C. section 360bbb-3(b)(1), unless the authorization is terminated or revoked.  Performed at [facility], [address] [place], [place] [address]   Protime-INR     Status: Abnormal   Collection Time: 12/20/23  2:29 PM  Result Value Ref Range   Prothrombin Time 20.3 (H) 11.4 - 15.2 seconds   INR 1.7 (H) 0.8 - 1.2    Comment: (NOTE) INR goal varies based on device and disease states. Performed at [facility], [address], [place], [place] [address]   APTT     Status: None   Collection Time: 12/20/23  2:29 PM  Result Value Ref Range   aPTT 26 24 - 36 seconds    Comment: Performed at [facility], [address] [place], [place] [address]  Blood culture (routine x 2)     Status: Abnormal (Preliminary result)   Collection Time: 12/20/23  2:45 PM   Specimen: BLOOD LEFT ARM  Result Value Ref Range   Specimen Description      BLOOD LEFT ARM Performed at [facility], [address], [place], [place] [address]    Special Requests      BOTTLES DRAWN AEROBIC AND ANAEROBIC Blood Culture results may not be optimal due to an inadequate volume of blood received in culture bottles Performed at [facility], [address], [place], [place] [address]    Culture  Setup  Time      GRAM POSITIVE COCCI AEROBIC BOTTLE ONLY CRITICAL RESULT CALLED TO, READ BACK BY AND VERIFIED WITH: PHARMD CRYSTAL ROBERTSON [id number] AT 1222 BY EC    Culture (A)     STREPTOCOCCUS MITIS/ORALIS SUSCEPTIBILITIES TO FOLLOW Performed at [facility], [address] [place], [place] [address]    Report Status PENDING   Blood Culture ID Panel (Reflexed)     Status: Abnormal   Collection Time: 12/20/23  2:45 PM  Result Value Ref Range   Enterococcus faecalis NOT DETECTED NOT DETECTED   Enterococcus Faecium NOT DETECTED NOT DETECTED   Listeria monocytogenes NOT DETECTED NOT DETECTED   Staphylococcus species NOT DETECTED NOT DETECTED   Staphylococcus aureus (BCID) NOT DETECTED NOT DETECTED   Staphylococcus epidermidis NOT DETECTED NOT DETECTED   Staphylococcus lugdunensis NOT DETECTED NOT DETECTED   Streptococcus species DETECTED (A) NOT DETECTED    Comment: Not Enterococcus species, Streptococcus agalactiae, Streptococcus pyogenes, or Streptococcus pneumoniae. CRITICAL RESULT CALLED TO, READ BACK BY AND VERIFIED WITH: PHARMD CRYSTAL ROBERTSON [id number] AT 1222 BY EC    Streptococcus agalactiae NOT DETECTED NOT DETECTED   Streptococcus pneumoniae NOT DETECTED NOT DETECTED   Streptococcus pyogenes NOT DETECTED NOT DETECTED   A.calcoaceticus-baumannii NOT DETECTED NOT DETECTED   Bacteroides fragilis NOT DETECTED NOT DETECTED   Enterobacterales NOT DETECTED NOT DETECTED   Enterobacter cloacae complex NOT DETECTED NOT DETECTED   Escherichia coli NOT DETECTED NOT DETECTED   Klebsiella aerogenes NOT DETECTED NOT DETECTED   Klebsiella oxytoca NOT DETECTED NOT DETECTED   Klebsiella  pneumoniae NOT DETECTED NOT DETECTED   Proteus species NOT DETECTED NOT DETECTED   Salmonella species NOT DETECTED NOT DETECTED   Serratia marcescens NOT DETECTED NOT DETECTED   Haemophilus influenzae NOT DETECTED NOT DETECTED   Neisseria meningitidis NOT DETECTED NOT DETECTED   Pseudomonas  aeruginosa NOT DETECTED NOT DETECTED   Stenotrophomonas maltophilia NOT DETECTED NOT DETECTED   Candida albicans NOT DETECTED NOT DETECTED   Candida auris NOT DETECTED NOT DETECTED   Candida glabrata NOT DETECTED NOT DETECTED   Candida krusei NOT DETECTED NOT DETECTED   Candida parapsilosis NOT DETECTED NOT DETECTED   Candida tropicalis NOT DETECTED NOT DETECTED   Cryptococcus neoformans/gattii NOT DETECTED NOT DETECTED    Comment: Performed at [facility], [address], [place], [place] [address]  I-Stat CG4 Lactic Acid     Status: None   Collection Time: 12/20/23  3:08 PM  Result Value Ref Range   Lactic Acid, Venous 0.5 0.5 - 1.9 mmol/L  Urinalysis, Routine w reflex microscopic -Urine, Clean Catch     Status: Abnormal   Collection Time: 12/20/23  3:51 PM  Result Value Ref Range   Color, Urine AMBER (A) YELLOW    Comment: BIOCHEMICALS MAY BE AFFECTED BY COLOR   APPearance CLEAR CLEAR   Specific Gravity, Urine 1.016 1.005 - 1.030   pH 5.0 5.0 - 8.0   Glucose, UA NEGATIVE NEGATIVE mg/dL   Hgb urine dipstick NEGATIVE NEGATIVE   Bilirubin Urine NEGATIVE NEGATIVE   Ketones, ur NEGATIVE NEGATIVE mg/dL   Protein, ur 30 (A) NEGATIVE mg/dL   Nitrite NEGATIVE NEGATIVE   Leukocytes,Ua NEGATIVE NEGATIVE   RBC / HPF 0-5 0 - 5 RBC/hpf   WBC, UA 6-10 0 - 5 WBC/hpf   Bacteria, UA RARE (A) NONE SEEN   Squamous Epithelial / HPF 0-5 0 - 5 /HPF    Comment: Performed at [facility], [address] [place], [place] [address]  Type and screen [facility]     Status: None (Preliminary result)   Collection Time: 12/20/23  5:20 PM  Result Value Ref Range   ABO/RH(D) A POS    Antibody Screen NEG    Sample Expiration 12/23/2023,2359    Unit Number [id number]    Blood Component Type RED CELLS,LR    Unit division 00    Status of Unit ISSUED    Transfusion Status OK TO TRANSFUSE    Crossmatch Result Compatible    Unit Number [id number]    Blood  Component Type RED CELLS,LR    Unit division 00    Status of Unit ISSUED    Transfusion Status OK TO TRANSFUSE    Crossmatch Result      Compatible Performed at [facility], [address], [place], [place] [address]   Prepare RBC (crossmatch)     Status: None   Collection Time: 12/20/23  5:20 PM  Result Value Ref Range   Order Confirmation      ORDER PROCESSED BY BLOOD BANK Performed at [facility], [address], [place], [place] [address]   I-Stat Lactic Acid, ED     Status: None   Collection Time: 12/20/23  5:31 PM  Result Value Ref Range   Lactic Acid, Venous 0.5 0.5 - 1.9 mmol/L  Basic metabolic panel     Status: Abnormal   Collection Time: 12/20/23  7:54 PM  Result Value Ref Range   Sodium 120 (L) 135 - 145 mmol/L  Potassium 4.4 3.5 - 5.1 mmol/L   Chloride 89 (L) 98 - 111 mmol/L   CO2 20 (L) 22 - 32 mmol/L   Glucose, Bld 140 (H) 70 - 99 mg/dL    Comment: Glucose reference range applies only to samples taken after fasting for at least 8 hours.   BUN 18 6 - 20 mg/dL   Creatinine, Ser 6.96 0.44 - 1.00 mg/dL   Calcium 6.9 (L) 8.9 - 10.3 mg/dL   GFR, Estimated >29 >52 mL/min    Comment: (NOTE) Calculated using the CKD-EPI Creatinine Equation (2021)    Anion gap 11 5 - 15    Comment: Performed at [facility], [address] [place], [place] [address]  Hemoglobin and hematocrit, blood     Status: Abnormal   Collection Time: 12/21/23 12:12 AM  Result Value Ref Range   Hemoglobin 7.5 (L) 12.0 - 15.0 g/dL   HCT 44.0 (L) 10.2 - 72.5 %    Comment: Performed at [facility], [address], [place], [place] [address]  CBC     Status: Abnormal   Collection Time: 12/21/23  3:46 AM  Result Value Ref Range   WBC 0.6 (LL) 4.0 - 10.5 K/uL    Comment: CALLED TO SIMON CHRISTINA, RN 12/21/23 Kathlene November REPEATED TO VERIFY    RBC 2.39 (L) 3.87 - 5.11 MIL/uL   Hemoglobin 7.3 (L) 12.0 - 15.0 g/dL   HCT  03.4 (L) 74.2 - 46.0 %   MCV 84.1 80.0 - 100.0 fL   MCH 30.5 26.0 - 34.0 pg   MCHC 36.3 (H) 30.0 - 36.0 g/dL   RDW 59.5 63.8 - 75.6 %   Platelets 129 (L) 150 - 400 K/uL   nRBC 0.0 0.0 - 0.2 %    Comment: Performed at [facility], [address], [place], [place] [address]  Comprehensive metabolic panel     Status: Abnormal   Collection Time: 12/21/23  3:46 AM  Result Value Ref Range   Sodium 120 (L) 135 - 145 mmol/L   Potassium 2.9 (L) 3.5 - 5.1 mmol/L   Chloride 91 (L) 98 - 111 mmol/L   CO2 21 (L) 22 - 32 mmol/L   Glucose, Bld 124 (H) 70 - 99 mg/dL    Comment: Glucose reference range applies only to samples taken after fasting for at least 8 hours.   BUN 20 6 - 20 mg/dL   Creatinine, Ser 5.18 0.44 - 1.00 mg/dL   Calcium 7.4 (L) 8.9 - 10.3 mg/dL   Total Protein 4.6 (L) 6.5 - 8.1 g/dL   Albumin 1.7 (L) 3.5 - 5.0 g/dL   AST <5 (L) 15 - 41 U/L   ALT 13 0 - 44 U/L   Alkaline Phosphatase 74 38 - 126 U/L   Total Bilirubin 1.4 (H) <1.2 mg/dL   GFR, Estimated >84 >16 mL/min    Comment: (NOTE) Calculated using the CKD-EPI Creatinine Equation (2021)    Anion gap 8 5 - 15    Comment: Performed at [facility], [address] [place], [place] [address]  Magnesium     Status: None   Collection Time: 12/21/23  3:46 AM  Result Value Ref Range   Magnesium 2.0 1.7 - 2.4 mg/dL    Comment: Performed at [facility], [address], [place], [place] [address]  Prepare RBC (crossmatch)     Status: None   Collection Time: 12/21/23  9:41 AM  Result Value Ref Range   Order Confirmation      ORDER PROCESSED BY BLOOD BANK Performed at [facility], [address], [place], [place] [address]   Renal function panel     Status: Abnormal   Collection Time: 12/21/23  4:45 PM  Result Value Ref Range   Sodium 129 (L) 135 - 145 mmol/L    Comment: DELTA CHECK NOTED   Potassium 4.3 3.5 - 5.1 mmol/L    Comment: DELTA CHECK  NOTED   Chloride 99 98 - 111 mmol/L   CO2 22 22 - 32 mmol/L   Glucose, Bld 109 (H) 70 - 99 mg/dL    Comment: Glucose reference range applies only to samples taken after fasting for at least 8 hours.   BUN 14 6 - 20 mg/dL   Creatinine, Ser 0.73 0.44 - 1.00 mg/dL   Calcium 7.7 (L) 8.9 - 10.3 mg/dL   Phosphorus 1.5 (L) 2.5 - 4.6 mg/dL   Albumin 1.8 (L) 3.5 - 5.0 g/dL   GFR, Estimated >71 >06 mL/min    Comment: (NOTE) Calculated using the CKD-EPI Creatinine Equation (2021)    Anion gap 8 5 - 15    Comment: Performed at [facility], [address], [place], [place] [address]  CBC with Differential/Platelet     Status: Abnormal   Collection Time: 12/21/23  4:45 PM  Result Value Ref Range   WBC 1.2 (LL) 4.0 - 10.5 K/uL    Comment: CRITICAL VALUE NOTED.  VALUE IS CONSISTENT WITH PREVIOUSLY REPORTED AND CALLED VALUE. REPEATED TO VERIFY    RBC 3.03 (L) 3.87 - 5.11 MIL/uL   Hemoglobin 8.8 (L) 12.0 - 15.0 g/dL   HCT 54.6 (L) 27.0 - 35.0 %   MCV 85.8 80.0 - 100.0 fL   MCH 29.0 26.0 - 34.0 pg   MCHC 33.8 30.0 - 36.0 g/dL   RDW 09.3 81.8 - 29.9 %   Platelets 167 150 - 400 K/uL   nRBC 0.0 0.0 - 0.2 %   Neutrophils Relative % 48 %   Neutro Abs 0.6 (L) 1.7 - 7.7 K/uL   Lymphocytes Relative 12 %   Lymphs Abs 0.2 (L) 0.7 - 4.0 K/uL   Monocytes Relative 11 %   Monocytes Absolute 0.1 0.1 - 1.0 K/uL   Eosinophils Relative 7 %   Eosinophils Absolute 0.1 0.0 - 0.5 K/uL   Basophils Relative 1 %   Basophils Absolute 0.0 0.0 - 0.1 K/uL   Immature Granulocytes 21 %   Abs Immature Granulocytes 0.26 (H) 0.00 - 0.07 K/uL   Polychromasia PRESENT    Ovalocytes PRESENT     Comment: Performed at [facility], [address], [place], [place] [address]  Comprehensive metabolic panel     Status: Abnormal   Collection Time: 12/22/23  3:17 AM  Result Value Ref Range   Sodium 122 (L) 135 - 145 mmol/L    Comment: DELTA CHECK NOTED   Potassium 3.9 3.5 - 5.1 mmol/L    Chloride 94 (L) 98 - 111 mmol/L   CO2 21 (L) 22 - 32 mmol/L   Glucose, Bld 97 70 - 99 mg/dL    Comment: Glucose reference range applies only to samples taken after fasting for at least 8 hours.   BUN 13 6 - 20 mg/dL   Creatinine, Ser 6.78 0.44 - 1.00 mg/dL   Calcium 7.4 (L) 8.9 - 10.3 mg/dL   Total Protein 4.4 (L) 6.5 - 8.1  g/dL   Albumin 1.8 (L) 3.5 - 5.0 g/dL   AST <5 (L) 15 - 41 U/L   ALT 9 0 - 44 U/L   Alkaline Phosphatase 64 38 - 126 U/L   Total Bilirubin 0.8 <1.2 mg/dL   GFR, Estimated >16 >10 mL/min    Comment: (NOTE) Calculated using the CKD-EPI Creatinine Equation (2021)    Anion gap 7 5 - 15    Comment: Performed at [facility], [address], [place], [place] [address]  CBC with Differential/Platelet     Status: Abnormal   Collection Time: 12/22/23  3:17 AM  Result Value Ref Range   WBC 1.9 (L) 4.0 - 10.5 K/uL   RBC 2.69 (L) 3.87 - 5.11 MIL/uL   Hemoglobin 8.3 (L) 12.0 - 15.0 g/dL   HCT 40.9 (L) 81.1 - 91.4 %   MCV 85.5 80.0 - 100.0 fL   MCH 30.9 26.0 - 34.0 pg   MCHC 36.1 (H) 30.0 - 36.0 g/dL   RDW 78.2 95.6 - 21.3 %   Platelets 138 (L) 150 - 400 K/uL   nRBC 0.0 0.0 - 0.2 %   Neutrophils Relative % 63 %   Neutro Abs 1.2 (L) 1.7 - 7.7 K/uL   Lymphocytes Relative 8 %   Lymphs Abs 0.2 (L) 0.7 - 4.0 K/uL   Monocytes Relative 12 %   Monocytes Absolute 0.2 0.1 - 1.0 K/uL   Eosinophils Relative 8 %   Eosinophils Absolute 0.2 0.0 - 0.5 K/uL   Basophils Relative 1 %   Basophils Absolute 0.0 0.0 - 0.1 K/uL   Immature Granulocytes 8 %   Abs Immature Granulocytes 0.15 (H) 0.00 - 0.07 K/uL    Comment: Performed at [facility], [address] [place], [place] [address]  Magnesium     Status: None   Collection Time: 12/22/23  3:17 AM  Result Value Ref Range   Magnesium 1.7 1.7 - 2.4 mg/dL    Comment: Performed at [facility], [address], [place], [place] [address]    Imaging / Studies: CT ABDOMEN  PELVIS W CONTRAST Result Date: 12/20/2023 CLINICAL DATA:  Acute abdominal pain and weakness, history of ureteral cancer with recent chemotherapy. EXAM: CT ABDOMEN AND PELVIS WITH CONTRAST TECHNIQUE: Multidetector CT imaging of the abdomen and pelvis was performed using the standard protocol following bolus administration of intravenous contrast. RADIATION DOSE REDUCTION: This exam was performed according to the departmental dose-optimization program which includes automated exposure control, adjustment of the mA and/or kV according to patient size and/or use of iterative reconstruction technique. CONTRAST:  OMNIPAQUE IOHEXOL 300 MG/ML  SOLN COMPARISON:  10/25/2022 PET-CT, by report from a recent exam dated 11/26/2023 at [facility]. FINDINGS: Lower chest: No acute abnormality. Hepatobiliary: Liver is within normal limits. Gallbladder is decompressed. Pancreas: Unremarkable. No pancreatic ductal dilatation or surrounding inflammatory changes. Spleen: Normal in size without focal abnormality. Adrenals/Urinary Tract: Adrenal glands are within normal limits bilaterally. Right kidney demonstrates normal enhancement pattern. No renal calculi or obstructive changes are seen. Left kidney has been surgically removed. Several peripherally enhancing lesions are identified in the left nephrectomy bed. Superiorly a 2.9 cm lesion is noted. More inferiorly a dominant 3.4 cm lesion is seen as well as a small lesion adjacent to the left ribcage on image number 37 which measures approximately 15 mm. Adjacent to the left psoas muscle, there is a peripherally enhancing lesion with air and fluid within which abuts the descending  colon best seen on image number 48 of series 2. There also appears to be invasion into the descending colonic wall with passage of fecal material into the necrotic lesion. Additionally multiple serosal implants are noted adjacent to the sigmoid colon. The largest of these measures approximately 4.9 x  3.8 cm. This is increased from a recent CT examination obtained at [facility] on 11/26/2023. Stomach/Bowel: No obstructive changes of the colon are seen. There are again noted multiple serosal implants along the sigmoid and descending colon. The descending colon lesion appears to communicate with the lumen of the colon. More proximal transverse colon appears within normal limits. The ascending colon and cecum demonstrates significant wall thickening with mucosal enhancement. These changes may be reactive in nature to the adjacent peritoneal and serosal implants adjacent to the sigmoid colon. The small bowel shows no obstructive changes. Stomach is within normal limits. Vascular/Lymphatic: Atherosclerotic calcifications are seen. A cystic lesion is again noted in the left periaortic space measuring 5.0 x 4.4 cm roughly stable from the prior exam with a small intra-aortocaval component previously described. Reproductive: Uterus has been surgically removed. No adnexal mass is noted. Other: Multiple peripherally enhancing peritoneal implants are noted along the dome of the bladder which by a report are stable. Musculoskeletal: No acute or significant osseous findings. IMPRESSION: Extensive metastatic disease within the left renal bed as well as diffusely throughout the peritoneum as described. These appear relatively stable when compared with the prior exam although 1 lesion along the posterior aspect of the descending colon appears increased in size with apparent invasion into the lumen of the descending colon. It now measures up to 4.6 cm and demonstrates air and fluid within which may be related to fecal material. It appears otherwise contained. Considerable wall thickening in the ascending colon and cecum. This likely represents focal colitis new from the prior exam. Some adjacent enhancing peritoneal implants are noted near the cecum and invasion could not be totally excluded. Electronically Signed   By: Alcide Clever M.D.   On: 12/20/2023 17:56   DG Chest Portable 1 View Result Date: 12/20/2023 CLINICAL DATA:  Fever, chills, shortness of breath, and weakness. History of metastatic renal cell cancer. EXAM: PORTABLE CHEST 1 VIEW COMPARISON:  PET-CT dated October 25, 2022. FINDINGS: Right chest wall port catheter with tip in the proximal right atrium. The heart size and mediastinal contours are within normal limits. Normal pulmonary vascularity. No focal consolidation, pleural effusion, or pneumothorax. No acute osseous abnormality. IMPRESSION: 1. No active disease. Electronically Signed   By: Obie Dredge M.D.   On: 12/20/2023 16:08    Medications / Allergies: per chart  Antibiotics: Anti-infectives (From admission, onward)    Start     Dose/Rate Route Frequency Ordered Stop   12/21/23 1000  cefTRIAXone (ROCEPHIN) 2 g in sodium chloride 0.9 % 100 mL IVPB        2 g 200 mL/hr over 30 Minutes Intravenous Every 24 hours 12/20/23 2205     12/21/23 0200  metroNIDAZOLE (FLAGYL) IVPB 500 mg        500 mg 100 mL/hr over 60 Minutes Intravenous Every 12 hours 12/20/23 2205     12/20/23 1415  ceFEPIme (MAXIPIME) 2 g in sodium chloride 0.9 % 100 mL IVPB        2 g 200 mL/hr over 30 Minutes Intravenous  Once 12/20/23 1403 12/20/23 1518   12/20/23 1415  metroNIDAZOLE (FLAGYL) IVPB 500 mg        500  mg 100 mL/hr over 60 Minutes Intravenous  Once 12/20/23 1403 12/20/23 1600   12/20/23 1415  vancomycin (VANCOCIN) IVPB 1000 mg/200 mL premix        1,000 mg 200 mL/hr over 60 Minutes Intravenous  Once 12/20/23 1403 12/20/23 1601         Note: Portions of this report may have been transcribed using voice recognition software. Every effort was made to ensure accuracy; however, inadvertent computerized transcription errors may be present.   Any transcriptional errors that result from this process are unintentional.    Ardeth Sportsman, MD, FACS, MASCRS Esophageal, Gastrointestinal & Colorectal  Surgery Robotic and Minimally Invasive Surgery  [facility] [address] [place], [place] [address] [phone] Fax [phone] Main  CONTACT INFORMATION: Weekday (9AM-5PM): Call [facility] main office at [phone] Weeknight (5PM-9AM) or Weekend/Holiday: Check EPIC "Web Links" tab & use "AMION" (password " [email or web address]") for General Surgery [facility] coverage  Please, DO NOT use SecureChat  (it is not reliable communication to reach operating surgeons & will lead to a delay in care).   Epic staff messaging available for outptient concerns needing 1-2 business day response.       12/22/2023  1:30 PM

## 2023-12-22 NOTE — Progress Notes (Signed)
°  Echocardiogram 2D Echocardiogram has been performed.  Barbara Nicholson 12/22/2023, 4:06 PM
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
## 2023-12-22 NOTE — Consult Note (Addendum)
[facility]                                                                                        Patient Identification: Patient Name: Barbara Nicholson MRN: [id number] Admit Date: 12/20/2023 12:57 PM Today's Date: 12/22/2023 Reason for consult: bacteremia  Requesting provider: Candelaria Stagers   Principal Problem:   Neutropenic fever (HCC) Active Problems:   Colitis   Pancytopenia (HCC)   Hyponatremia   Hypokalemia   Urothelial cancer (HCC)   Kidney cancer, primary, with metastasis from kidney to other site, left [facility])   Antibiotics:  Vancomycin 12/27 Cefepime 12/27 Ceftriaxone 12/28- Metronidazole 12/27-12/28  Lines/Hardware: Rt chest port +_  Assessment # Strep mitis/oralis bacteremia: only 1 out of 4 bottles but she came in with sepsis picture and likely true bacteremia. She has a chest port with no concerns but recently accessed for chemotherapy and would need to be removed. She is agreeable but thinks she will need a replacement. GI source also considered.   # Pancytopenia  - in the setting of chemotherapy for metastatic malignancy  - improving, s/p PRBC transfusion  - CT abdomen pelvis to be reviewed by patient's primary oncologist at [facility] for concerns of progression of malignancy  - Neutropenia has resolved. ANC today is 1200  # Diarrhea/Colitis in CT - diarrhea has improved, no need for C diff and GIP  - H/o Ulcerative colitis, in remission and not on tx per patient  Recommendations  - continue ceftriaxone + addendum * metronidazole - Fu repeat blood cultures - TTE ordered given strep viridans group bacteremia  - Port to be removed, patient is agreeable - Follow up Oncology recs  - Monitor CBC and BMP - d/w primary team  Dr Luciana Axe on starting tomorrow.   Rest of the management as per the primary team. Please call with questions or concerns.  Thank you for the  consult  __________________________________________________________________________________________________________ HPI and Hospital Course: 47 year-old female with PMH of ulcerative colitis not on tx, follows at [facility], left renal pelvis urothelial cancer with metastasis to lung and retroperitoneal lymph nodes status post nephroureterectomy, EV + Keytruda, SBRT, chemotherapy with new peritoneal disease started on chemotherapy Drinda Butts, last dose 12/18, follows at [facility] who presented to the ED on 12/27 with weakness, dizziness, loose stools, shortness of breath including fevers and chills/night sweats after recent chemotherapy.  She was started on a new chemotherapy Drinda Butts first dose on 12/11 and second dose on 12/18.  Shortly after second dose of chemotherapy started having profound intractable diarrhea with abdominal pain, generalized weakness the following week and finally came to the ED due to fevers, chills and rigors the day of presentation.   At ED, afebrile soft BP diffuse tenderness to lower abdomen Labs remarkable for NA 115, K3.4 albumin 2.2 AST 5, TB 1.7, WBC 0.3, hemoglobin 6.8, platelets 149, ANC 0.1.  Influenza A/B/RSV/SARScov2 negative.  UA Unremarkable for UTI Was given IVF, broad-spectrum antibiotics IV vancomycin, cefepime and metronidazole for undifferentiated sepsis Chest X-ray with no acute abnormality CT abdomen pelvis findings noted  Her oncologist at [facility] was contacted for transfer but recommended  to admit at [facility] due to severe hyponatremia.   ROS: General- Denies fever, chills, loss of appetite and loss of weight HEENT - Denies headache, blurry vision, neck pain, sinus pain Chest - Denies any chest pain, SOB or cough CVS- Denies any dizziness/lightheadedness, syncopal attacks, palpitations Abdomen- Denies any nausea, vomiting, abdominal pain, hematochezia and diarrhea Neuro - Denies any weakness, numbness, tingling sensation Psych - Denies any changes in mood irritability  or depressive symptoms GU- Denies any burning, dysuria, hematuria or increased frequency of urination Skin - denies any rashes/lesions MSK - denies any joint pain/swelling or restricted ROM   Past Medical History:  Diagnosis Date   AMA (advanced maternal age) multigravida 35+    Anemia    hx of anemia due to fibroids   B12 deficiency 2015   Concussion with < 1 hr loss of consciousness 09/10/2018   Patient fell at home and hit her head. HA, confusion, amnesia around the time of the accident.   Generalized anxiety disorder 2021   work-related, better as of 04/06/22   Heart murmur    as a baby   Hx of ulcerative colitis    Malignant neoplasm (HCC) 2023   kidney, lungs - On chemotherapy   Syncope 03/14/2014   syncope after epidsode of explosive diarrhea   Ulcerative colitis (HCC)    hx of left sided colitis 05/23/18   Wears contact lenses    Wears glasses    Past Surgical History:  Procedure Laterality Date   ABDOMINAL HYSTERECTOMY     CESAREAN SECTION N/A 10/13/2013   Procedure: Primary Cesarean Section Delivery Baby  Girl  @ 0122, Apgars 9/9   ;  Surgeon: Jeani Hawking, MD;  Location: [facility];  Service: Obstetrics;  Laterality: N/A;   COLONOSCOPY  2020   HYSTERECTOMY ABDOMINAL WITH SALPINGECTOMY N/A 04/10/2022   Procedure: HYSTERECTOMY ABDOMINAL WITH BILATERAL SALPINGECTOMY;  Surgeon: Marcelle Overlie, MD;  Location: [facility];  Service: Gynecology;  Laterality: N/A;   UTERINE SEPTUM RESECTION     around 2006   WISDOM TOOTH EXTRACTION  2002     Scheduled Meds:  Chlorhexidine Gluconate Cloth  6 each Topical Daily   feeding supplement  237 mL Oral BID BM   LORazepam  1 mg Oral TID   methadone  5 mg Oral Q8H   pantoprazole  40 mg Oral Daily   simethicone  80 mg Oral QID   sodium chloride flush  10-40 mL Intracatheter Q12H   Continuous Infusions:  0.9 % NaCl with KCl 20 mEq / L 75 mL/hr at 12/22/23 0837   cefTRIAXone (ROCEPHIN)  IV 2 g (12/22/23 0900)    metronidazole Stopped (12/22/23 0332)   PRN Meds:.acetaminophen, diphenhydrAMINE, morphine injection, mouth rinse, oxycodone, simethicone, sodium chloride flush  Allergies  Allergen Reactions   Piperacillin Sod-Tazobactam So Anaphylaxis and Other (See Comments)    First dose- swollen lips, scratchy throat   Zosyn [Piperacillin-Tazobactam In Dex] Anaphylaxis   Social History   Socioeconomic History   Marital status: Married    Spouse name: Not on file   Number of children: Not on file   Years of education: Not on file   Highest education level: Not on file  Occupational History   Not on file  Tobacco Use   Smoking status: Never   Smokeless tobacco: Never  Vaping Use   Vaping status: Never Used  Substance and Sexual Activity   Alcohol use: Yes    Comment: Socially    Drug  use: No   Sexual activity: Yes    Comment: husband vasectomy  Other Topics Concern   Not on file  Social History Narrative   Not on file   Social Drivers of Health   Financial Resource Strain: Low Risk  (01/28/2023)   Received from [facility]   Overall Financial Resource Strain (CARDIA)    Difficulty of Paying Living Expenses: Not very hard  Food Insecurity: No Food Insecurity (12/21/2023)   Hunger Vital Sign    Worried About Running Out of Food in the Last Year: Never true    Ran Out of Food in the Last Year: Never true  Transportation Needs: No Transportation Needs (12/21/2023)   PRAPARE - Administrator, Civil Service (Medical): No    Lack of Transportation (Non-Medical): No  Physical Activity: Not on file  Stress: Not on file  Social Connections: Not on file  Intimate Partner Violence: Not At Risk (12/21/2023)   Humiliation, Afraid, Rape, and Kick questionnaire    Fear of Current or Ex-Partner: No    Emotionally Abused: No    Physically Abused: No    Sexually Abused: No   Family History  Problem Relation Age of Onset   Mesothelioma Mother    Vitals BP  97/63 (BP Location: Right Arm)   Pulse 85   Temp 99.2 F (37.3 C) (Oral)   Resp 16   Ht 5\' 4"  (1.626 m)   Wt 62 kg   LMP 03/16/2022 (Approximate)   SpO2 97%   BMI 23.46 kg/m   Physical Exam Constitutional:  adult female sitting in the bed, not in acute distress and appears comfortable     Comments: HEENT wnl, teeth looks ok  Cardiovascular:     Rate and Rhythm: Normal rate and regular rhythm.     Heart sounds: s1s2  Pulmonary:     Effort: Pulmonary effort is normal.     Comments: Normal breath sounds   Abdominal:     Palpations: Abdomen is soft.     Tenderness: non distended and non tender   Musculoskeletal:        General: No swelling or tenderness in peripheral joints  Skin:    Comments: No rashes, rt chest port but no overlying redness, swelling of tenderness   Neurological:     General: awake, alert and oriented, grossly non focal and following commands   Psychiatric:        Mood and Affect: Mood normal.    Pertinent Microbiology Results for orders placed or performed during the hospital encounter of 12/20/23  Blood culture (routine x 2)     Status: None (Preliminary result)   Collection Time: 12/20/23  2:29 PM   Specimen: BLOOD LEFT ARM  Result Value Ref Range Status   Specimen Description   Final    BLOOD LEFT ARM Performed at [facility], [address], [place], [place] [address]    Special Requests   Final    BOTTLES DRAWN AEROBIC AND ANAEROBIC Blood Culture results may not be optimal due to an inadequate volume of blood received in culture bottles Performed at [facility], [address] [place], [place] [address]    Culture   Final    NO GROWTH 2 DAYS Performed at [facility], [address], [place], [place] [address]    Report Status PENDING  Incomplete  Resp panel by RT-PCR (RSV, Flu A&B, Covid) Anterior Nasal Swab  Status: None   Collection Time: 12/20/23  2:29 PM   Specimen: Anterior Nasal Swab   Result Value Ref Range Status   SARS Coronavirus 2 by RT PCR NEGATIVE NEGATIVE Final    Comment: (NOTE) SARS-CoV-2 target nucleic acids are NOT DETECTED.  The SARS-CoV-2 RNA is generally detectable in upper respiratory specimens during the acute phase of infection. The lowest concentration of SARS-CoV-2 viral copies this assay can detect is 138 copies/mL. A negative result does not preclude SARS-Cov-2 infection and should not be used as the sole basis for treatment or other patient management decisions. A negative result may occur with  improper specimen collection/handling, submission of specimen other than nasopharyngeal swab, presence of viral mutation(s) within the areas targeted by this assay, and inadequate number of viral copies(<138 copies/mL). A negative result must be combined with clinical observations, patient history, and epidemiological information. The expected result is Negative.  Fact Sheet for Patients:  BloggerCourse.com  Fact Sheet for Healthcare Providers:  SeriousBroker.it  This test is no t yet approved or cleared by the [place] FDA and  has been authorized for detection and/or diagnosis of SARS-CoV-2 by FDA under an Emergency Use Authorization (EUA). This EUA will remain  in effect (meaning this test can be used) for the duration of the COVID-19 declaration under Section 564(b)(1) of the Act, 21 U.S.C.section 360bbb-3(b)(1), unless the authorization is terminated  or revoked sooner.       Influenza A by PCR NEGATIVE NEGATIVE Final   Influenza B by PCR NEGATIVE NEGATIVE Final    Comment: (NOTE) The Xpert Xpress SARS-CoV-2/FLU/RSV plus assay is intended as an aid in the diagnosis of influenza from Nasopharyngeal swab specimens and should not be used as a sole basis for treatment. Nasal washings and aspirates are unacceptable for Xpert Xpress SARS-CoV-2/FLU/RSV testing.  Fact Sheet for  Patients: BloggerCourse.com  Fact Sheet for Healthcare Providers: SeriousBroker.it  This test is not yet approved or cleared by the [place] FDA and has been authorized for detection and/or diagnosis of SARS-CoV-2 by FDA under an Emergency Use Authorization (EUA). This EUA will remain in effect (meaning this test can be used) for the duration of the COVID-19 declaration under Section 564(b)(1) of the Act, 21 U.S.C. section 360bbb-3(b)(1), unless the authorization is terminated or revoked.     Resp Syncytial Virus by PCR NEGATIVE NEGATIVE Final    Comment: (NOTE) Fact Sheet for Patients: BloggerCourse.com  Fact Sheet for Healthcare Providers: SeriousBroker.it  This test is not yet approved or cleared by the [place] FDA and has been authorized for detection and/or diagnosis of SARS-CoV-2 by FDA under an Emergency Use Authorization (EUA). This EUA will remain in effect (meaning this test can be used) for the duration of the COVID-19 declaration under Section 564(b)(1) of the Act, 21 U.S.C. section 360bbb-3(b)(1), unless the authorization is terminated or revoked.  Performed at [facility], [address], [place], [place] [address]   Blood culture (routine x 2)     Status: None (Preliminary result)   Collection Time: 12/20/23  2:45 PM   Specimen: BLOOD LEFT ARM  Result Value Ref Range Status   Specimen Description   Final    BLOOD LEFT ARM Performed at [facility], [address], [place], [place] [address]    Special Requests   Final    BOTTLES DRAWN AEROBIC AND ANAEROBIC Blood Culture results may not be optimal due to an inadequate volume of blood received in culture bottles Performed  at [facility], [address], [place], [place] [address]    Culture  Setup Time   Final    GRAM POSITIVE COCCI AEROBIC BOTTLE  ONLY CRITICAL RESULT CALLED TO, READ BACK BY AND VERIFIED WITH: [facility] CRYSTAL ROBERTSON [id number] AT 1222 BY EC Performed at [facility], [address], [place], [place] [address]    Culture GRAM POSITIVE COCCI  Final   Report Status PENDING  Incomplete  Blood Culture ID Panel (Reflexed)     Status: Abnormal   Collection Time: 12/20/23  2:45 PM  Result Value Ref Range Status   Enterococcus faecalis NOT DETECTED NOT DETECTED Final   Enterococcus Faecium NOT DETECTED NOT DETECTED Final   Listeria monocytogenes NOT DETECTED NOT DETECTED Final   Staphylococcus species NOT DETECTED NOT DETECTED Final   Staphylococcus aureus (BCID) NOT DETECTED NOT DETECTED Final   Staphylococcus epidermidis NOT DETECTED NOT DETECTED Final   Staphylococcus lugdunensis NOT DETECTED NOT DETECTED Final   Streptococcus species DETECTED (A) NOT DETECTED Final    Comment: Not Enterococcus species, Streptococcus agalactiae, Streptococcus pyogenes, or Streptococcus pneumoniae. CRITICAL RESULT CALLED TO, READ BACK BY AND VERIFIED WITH: PHARMD CRYSTAL ROBERTSON [id number] AT 1222 BY EC    Streptococcus agalactiae NOT DETECTED NOT DETECTED Final   Streptococcus pneumoniae NOT DETECTED NOT DETECTED Final   Streptococcus pyogenes NOT DETECTED NOT DETECTED Final   A.calcoaceticus-baumannii NOT DETECTED NOT DETECTED Final   Bacteroides fragilis NOT DETECTED NOT DETECTED Final   Enterobacterales NOT DETECTED NOT DETECTED Final   Enterobacter cloacae complex NOT DETECTED NOT DETECTED Final   Escherichia coli NOT DETECTED NOT DETECTED Final   Klebsiella aerogenes NOT DETECTED NOT DETECTED Final   Klebsiella oxytoca NOT DETECTED NOT DETECTED Final   Klebsiella pneumoniae NOT DETECTED NOT DETECTED Final   Proteus species NOT DETECTED NOT DETECTED Final   Salmonella species NOT DETECTED NOT DETECTED Final   Serratia marcescens NOT DETECTED NOT DETECTED Final   Haemophilus influenzae NOT DETECTED NOT DETECTED Final    Neisseria meningitidis NOT DETECTED NOT DETECTED Final   Pseudomonas aeruginosa NOT DETECTED NOT DETECTED Final   Stenotrophomonas maltophilia NOT DETECTED NOT DETECTED Final   Candida albicans NOT DETECTED NOT DETECTED Final   Candida auris NOT DETECTED NOT DETECTED Final   Candida glabrata NOT DETECTED NOT DETECTED Final   Candida krusei NOT DETECTED NOT DETECTED Final   Candida parapsilosis NOT DETECTED NOT DETECTED Final   Candida tropicalis NOT DETECTED NOT DETECTED Final   Cryptococcus neoformans/gattii NOT DETECTED NOT DETECTED Final    Comment: Performed at [facility], [address] [place], [place] [address]   Pertinent Lab seen by me:    Latest Ref Rng & Units 12/22/2023    3:17 AM 12/21/2023    4:45 PM 12/21/2023    3:46 AM  CBC  WBC 4.0 - 10.5 K/uL 1.9  1.2  0.6   Hemoglobin 12.0 - 15.0 g/dL 8.3  8.8  7.3   Hematocrit 36.0 - 46.0 % 23.0  26.0  20.1   Platelets 150 - 400 K/uL 138  167  129       Latest Ref Rng & Units 12/22/2023    3:17 AM 12/21/2023    4:45 PM 12/21/2023    3:46 AM  CMP  Glucose 70 - 99 mg/dL 97  272  536   BUN 6 - 20 mg/dL 13  14  20    Creatinine 0.44 - 1.00 mg/dL 6.44  0.34  0.81   Sodium 135 - 145 mmol/L 122  129  120   Potassium 3.5 - 5.1 mmol/L 3.9  4.3  2.9   Chloride 98 - 111 mmol/L 94  99  91   CO2 22 - 32 mmol/L 21  22  21    Calcium 8.9 - 10.3 mg/dL 7.4  7.7  7.4   Total Protein 6.5 - 8.1 g/dL 4.4   4.6   Total Bilirubin <1.2 mg/dL 0.8   1.4   Alkaline Phos 38 - 126 U/L 64   74   AST 15 - 41 U/L <5   <5   ALT 0 - 44 U/L 9   13     Pertinent Imagings/Other Imagings Plain films and CT images have been personally visualized and interpreted; radiology reports have been reviewed. Decision making incorporated into the Impression / Recommendations.  CT ABDOMEN PELVIS W CONTRAST Result Date: 12/20/2023 CLINICAL DATA:  Acute abdominal pain and weakness, history of ureteral cancer with recent chemotherapy. EXAM: CT ABDOMEN AND  PELVIS WITH CONTRAST TECHNIQUE: Multidetector CT imaging of the abdomen and pelvis was performed using the standard protocol following bolus administration of intravenous contrast. RADIATION DOSE REDUCTION: This exam was performed according to the departmental dose-optimization program which includes automated exposure control, adjustment of the mA and/or kV according to patient size and/or use of iterative reconstruction technique. CONTRAST:  OMNIPAQUE IOHEXOL 300 MG/ML  SOLN COMPARISON:  10/25/2022 PET-CT, by report from a recent exam dated 11/26/2023 at [facility]. FINDINGS: Lower chest: No acute abnormality. Hepatobiliary: Liver is within normal limits. Gallbladder is decompressed. Pancreas: Unremarkable. No pancreatic ductal dilatation or surrounding inflammatory changes. Spleen: Normal in size without focal abnormality. Adrenals/Urinary Tract: Adrenal glands are within normal limits bilaterally. Right kidney demonstrates normal enhancement pattern. No renal calculi or obstructive changes are seen. Left kidney has been surgically removed. Several peripherally enhancing lesions are identified in the left nephrectomy bed. Superiorly a 2.9 cm lesion is noted. More inferiorly a dominant 3.4 cm lesion is seen as well as a small lesion adjacent to the left ribcage on image number 37 which measures approximately 15 mm. Adjacent to the left psoas muscle, there is a peripherally enhancing lesion with air and fluid within which abuts the descending colon best seen on image number 48 of series 2. There also appears to be invasion into the descending colonic wall with passage of fecal material into the necrotic lesion. Additionally multiple serosal implants are noted adjacent to the sigmoid colon. The largest of these measures approximately 4.9 x 3.8 cm. This is increased from a recent CT examination obtained at [facility] on 11/26/2023. Stomach/Bowel: No obstructive changes of the colon are seen. There are  again noted multiple serosal implants along the sigmoid and descending colon. The descending colon lesion appears to communicate with the lumen of the colon. More proximal transverse colon appears within normal limits. The ascending colon and cecum demonstrates significant wall thickening with mucosal enhancement. These changes may be reactive in nature to the adjacent peritoneal and serosal implants adjacent to the sigmoid colon. The small bowel shows no obstructive changes. Stomach is within normal limits. Vascular/Lymphatic: Atherosclerotic calcifications are seen. A cystic lesion is again noted in the left periaortic space measuring 5.0 x 4.4 cm roughly stable from the prior exam with a small intra-aortocaval component previously described. Reproductive: Uterus has been surgically removed. No adnexal mass is noted. Other: Multiple peripherally enhancing peritoneal implants are noted along the dome of the bladder which  by a report are stable. Musculoskeletal: No acute or significant osseous findings. IMPRESSION: Extensive metastatic disease within the left renal bed as well as diffusely throughout the peritoneum as described. These appear relatively stable when compared with the prior exam although 1 lesion along the posterior aspect of the descending colon appears increased in size with apparent invasion into the lumen of the descending colon. It now measures up to 4.6 cm and demonstrates air and fluid within which may be related to fecal material. It appears otherwise contained. Considerable wall thickening in the ascending colon and cecum. This likely represents focal colitis new from the prior exam. Some adjacent enhancing peritoneal implants are noted near the cecum and invasion could not be totally excluded. Electronically Signed   By: Alcide Clever M.D.   On: 12/20/2023 17:56   DG Chest Portable 1 View Result Date: 12/20/2023 CLINICAL DATA:  Fever, chills, shortness of breath, and weakness. History of  metastatic renal cell cancer. EXAM: PORTABLE CHEST 1 VIEW COMPARISON:  PET-CT dated October 25, 2022. FINDINGS: Right chest wall port catheter with tip in the proximal right atrium. The heart size and mediastinal contours are within normal limits. Normal pulmonary vascularity. No focal consolidation, pleural effusion, or pneumothorax. No acute osseous abnormality. IMPRESSION: 1. No active disease. Electronically Signed   By: Obie Dredge M.D.   On: 12/20/2023 16:08    I have personally spent 85 minutes involved in face-to-face and non-face-to-face activities for this patient on the day of the visit. Professional time spent includes the following activities: Preparing to see the patient (review of tests), Obtaining and/or reviewing separately obtained history (admission/discharge record), Performing a medically appropriate examination and/or evaluation , Ordering medications/tests/procedures, referring and communicating with other health care professionals, Documenting clinical information in the EMR, Independently interpreting results (not separately reported), Communicating results to the patient/family/caregiver, Counseling and educating the patient/family/caregiver and Care coordination (not separately reported).  Electronically signed by:   Plan d/w requesting provider as well as ID pharm D  Of note, portions of this note may have been created with voice recognition software. While this note has been edited for accuracy, occasional wrong-word or sound-a-like substitutions may have occurred due to the inherent limitations of voice recognition software.   Odette Fraction, MD Infectious Disease Physician [facility] Pager: [phone]

## 2023-12-22 NOTE — Plan of Care (Signed)
Plan of care and goals review with patient and husband, time given for questions, patient assisted to and from bathroom with issues, IVF infusing well, vital within normal limits at this time. Patient handbook/guide at bedside, bed in low locked position with call bell in reach and bed alarm on, husband to stay over night with patient.  Problem: Education: Goal: Knowledge of General Education information will improve Description: Including pain rating scale, medication(s)/side effects and non-pharmacologic comfort measures Outcome: Progressing   Problem: Health Behavior/Discharge Planning: Goal: Ability to manage health-related needs will improve Outcome: Progressing   Problem: Clinical Measurements: Goal: Ability to maintain clinical measurements within normal limits will improve Outcome: Progressing Goal: Will remain free from infection Outcome: Progressing Goal: Diagnostic test results will improve Outcome: Progressing Goal: Respiratory complications will improve Outcome: Progressing Goal: Cardiovascular complication will be avoided Outcome: Progressing   Problem: Activity: Goal: Risk for activity intolerance will decrease Outcome: Progressing   Problem: Nutrition: Goal: Adequate nutrition will be maintained Outcome: Progressing   Problem: Coping: Goal: Level of anxiety will decrease Outcome: Progressing   Problem: Elimination: Goal: Will not experience complications related to bowel motility Outcome: Progressing Goal: Will not experience complications related to urinary retention Outcome: Progressing   Problem: Pain Management: Goal: General experience of comfort will improve Outcome: Progressing   Problem: Safety: Goal: Ability to remain free from injury will improve Outcome: Progressing   Problem: Skin Integrity: Goal: Risk for impaired skin integrity will decrease Outcome: Progressing

## 2023-12-22 NOTE — Plan of Care (Signed)

## 2023-12-23 ENCOUNTER — Inpatient Hospital Stay (HOSPITAL_COMMUNITY): Payer: No Typology Code available for payment source

## 2023-12-23 DIAGNOSIS — E871 Hypo-osmolality and hyponatremia: Secondary | ICD-10-CM | POA: Diagnosis not present

## 2023-12-23 DIAGNOSIS — D709 Neutropenia, unspecified: Secondary | ICD-10-CM | POA: Diagnosis not present

## 2023-12-23 DIAGNOSIS — E876 Hypokalemia: Secondary | ICD-10-CM | POA: Diagnosis not present

## 2023-12-23 DIAGNOSIS — R5081 Fever presenting with conditions classified elsewhere: Secondary | ICD-10-CM | POA: Diagnosis not present

## 2023-12-23 DIAGNOSIS — C689 Malignant neoplasm of urinary organ, unspecified: Secondary | ICD-10-CM | POA: Diagnosis not present

## 2023-12-23 HISTORY — PX: IR REMOVAL TUN ACCESS W/ PORT W/O FL MOD SED: IMG2290

## 2023-12-23 LAB — RENAL FUNCTION PANEL
Albumin: 1.7 g/dL — ABNORMAL LOW (ref 3.5–5.0)
Anion gap: 5 (ref 5–15)
BUN: 11 mg/dL (ref 6–20)
CO2: 24 mmol/L (ref 22–32)
Calcium: 7.1 mg/dL — ABNORMAL LOW (ref 8.9–10.3)
Chloride: 96 mmol/L — ABNORMAL LOW (ref 98–111)
Creatinine, Ser: 0.66 mg/dL (ref 0.44–1.00)
GFR, Estimated: >60 mL/min (ref >60)
Glucose, Bld: 97 mg/dL (ref 70–99)
Phosphorus: 1.9 mg/dL — ABNORMAL LOW (ref 2.5–4.6)
Potassium: 3.8 mmol/L (ref 3.5–5.1)
Sodium: 125 mmol/L — ABNORMAL LOW (ref 135–145)

## 2023-12-23 LAB — CBC WITH DIFFERENTIAL/PLATELET
Abs Immature Granulocytes: 0.34 10*3/uL — ABNORMAL HIGH (ref 0.00–0.07)
Basophils Absolute: 0 10*3/uL (ref 0.0–0.1)
Basophils Relative: 1 %
Eosinophils Absolute: 0.3 10*3/uL (ref 0.0–0.5)
Eosinophils Relative: 7 %
HCT: 23.5 % — ABNORMAL LOW (ref 36.0–46.0)
Hemoglobin: 8 g/dL — ABNORMAL LOW (ref 12.0–15.0)
Immature Granulocytes: 10 %
Lymphocytes Relative: 6 %
Lymphs Abs: 0.2 10*3/uL — ABNORMAL LOW (ref 0.7–4.0)
MCH: 30.2 pg (ref 26.0–34.0)
MCHC: 34 g/dL (ref 30.0–36.0)
MCV: 88.7 fL (ref 80.0–100.0)
Monocytes Absolute: 0.3 10*3/uL (ref 0.1–1.0)
Monocytes Relative: 10 %
Neutro Abs: 2.3 10*3/uL (ref 1.7–7.7)
Neutrophils Relative %: 66 %
Platelets: 129 10*3/uL — ABNORMAL LOW (ref 150–400)
RBC: 2.65 MIL/uL — ABNORMAL LOW (ref 3.87–5.11)
RDW: 14.9 % (ref 11.5–15.5)
WBC: 3.4 10*3/uL — ABNORMAL LOW (ref 4.0–10.5)
nRBC: 0 % (ref 0.0–0.2)

## 2023-12-23 LAB — TYPE AND SCREEN
ABO/RH(D): A POS
Antibody Screen: NEGATIVE
Unit division: 0
Unit division: 0

## 2023-12-23 LAB — BPAM RBC
Blood Product Expiration Date: 202501292359
Blood Product Expiration Date: 202501272359
ISSUE DATE / TIME: 202412281056
ISSUE DATE / TIME: 202412271840
Unit Type and Rh: 6200
Unit Type and Rh: 6200

## 2023-12-23 LAB — CULTURE, BLOOD (ROUTINE X 2)

## 2023-12-23 LAB — MAGNESIUM: Magnesium: 1.7 mg/dL (ref 1.7–2.4)

## 2023-12-23 MED ORDER — POTASSIUM CHLORIDE IN NACL 20-0.9 MEQ/L-% IV SOLN
INTRAVENOUS | Status: AC
  Filled 2023-12-23 (×2): qty 1000

## 2023-12-23 MED ORDER — LIDOCAINE-EPINEPHRINE 1 %-1:100000 IJ SOLN
10.0000 mL | Freq: Once | INTRAMUSCULAR | Status: AC
  Administered 2023-12-23: 10 mL via INTRADERMAL
  Filled 2023-12-23: qty 10

## 2023-12-23 MED ORDER — LIDOCAINE-EPINEPHRINE 1 %-1:100000 IJ SOLN
INTRAMUSCULAR | Status: AC
  Filled 2023-12-23: qty 1

## 2023-12-23 NOTE — Progress Notes (Signed)
[facility]   Reason for visit: Follow up on bacteremia  Interval History: 1 positive blood culture from 12/27 with Strep mitis.  Remaining blood cultures remain no growth.  Repeat cultures from yesterday remain no growth.    Physical Exam: Constitutional:  Vitals:   12/23/23 0554 12/23/23 1204  BP: 106/64 103/65  Pulse: 80 87  Resp: 16 16  Temp: 98.3 F (36.8 C) 98.4 F (36.9 C)  SpO2: 97% 99%   patient appears in NAD Respiratory: Normal respiratory effort  Review of Systems: Constitutional: negative for fevers and chills  Lab Results  Component Value Date   WBC 3.4 (L) 12/23/2023   HGB 8.0 (L) 12/23/2023   HCT 23.5 (L) 12/23/2023   MCV 88.7 12/23/2023   PLT 129 (L) 12/23/2023    Lab Results  Component Value Date   CREATININE 0.66 12/23/2023   BUN 11 12/23/2023   NA 125 (L) 12/23/2023   K 3.8 12/23/2023   CL 96 (L) 12/23/2023   CO2 24 12/23/2023    Lab Results  Component Value Date   ALT 9 12/22/2023   AST <5 (L) 12/22/2023   ALKPHOS 64 12/22/2023     Microbiology: Recent Results (from the past 240 hours)  Blood culture (routine x 2)     Status: None (Preliminary result)   Collection Time: 12/20/23  2:29 PM   Specimen: BLOOD LEFT ARM  Result Value Ref Range Status   Specimen Description   Final    BLOOD LEFT ARM Performed at [facility], [address] [place], [place] [address]    Special Requests   Final    BOTTLES DRAWN AEROBIC AND ANAEROBIC Blood Culture results may not be optimal due to an inadequate volume of blood received in culture bottles Performed at [facility], [address], [place], [place] [address]    Culture   Final    NO GROWTH 2 DAYS Performed at [facility], [address] [place], [place] [address]    Report Status PENDING  Incomplete  Resp panel by RT-PCR (RSV, Flu A&B, Covid) Anterior Nasal Swab     Status: None   Collection Time: 12/20/23  2:29 PM    Specimen: Anterior Nasal Swab  Result Value Ref Range Status   SARS Coronavirus 2 by RT PCR NEGATIVE NEGATIVE Final    Comment: (NOTE) SARS-CoV-2 target nucleic acids are NOT DETECTED.  The SARS-CoV-2 RNA is generally detectable in upper respiratory specimens during the acute phase of infection. The lowest concentration of SARS-CoV-2 viral copies this assay can detect is 138 copies/mL. A negative result does not preclude SARS-Cov-2 infection and should not be used as the sole basis for treatment or other patient management decisions. A negative result may occur with  improper specimen collection/handling, submission of specimen other than nasopharyngeal swab, presence of viral mutation(s) within the areas targeted by this assay, and inadequate number of viral copies(<138 copies/mL). A negative result must be combined with clinical observations, patient history, and epidemiological information. The expected result is Negative.  Fact Sheet for Patients:  BloggerCourse.com  Fact Sheet for Healthcare Providers:  SeriousBroker.it  This test is no t yet approved or cleared by the [place] FDA and  has been authorized for detection and/or diagnosis of SARS-CoV-2 by FDA under an Emergency Use Authorization (EUA). This EUA will remain  in effect (meaning this test can be used) for the duration of the COVID-19 declaration under  Section 564(b)(1) of the Act, 21 U.S.C.section 360bbb-3(b)(1), unless the authorization is terminated  or revoked sooner.       Influenza A by PCR NEGATIVE NEGATIVE Final   Influenza B by PCR NEGATIVE NEGATIVE Final    Comment: (NOTE) The Xpert Xpress SARS-CoV-2/FLU/RSV plus assay is intended as an aid in the diagnosis of influenza from Nasopharyngeal swab specimens and should not be used as a sole basis for treatment. Nasal washings and aspirates are unacceptable for Xpert Xpress  SARS-CoV-2/FLU/RSV testing.  Fact Sheet for Patients: BloggerCourse.com  Fact Sheet for Healthcare Providers: SeriousBroker.it  This test is not yet approved or cleared by the [place] FDA and has been authorized for detection and/or diagnosis of SARS-CoV-2 by FDA under an Emergency Use Authorization (EUA). This EUA will remain in effect (meaning this test can be used) for the duration of the COVID-19 declaration under Section 564(b)(1) of the Act, 21 U.S.C. section 360bbb-3(b)(1), unless the authorization is terminated or revoked.     Resp Syncytial Virus by PCR NEGATIVE NEGATIVE Final    Comment: (NOTE) Fact Sheet for Patients: BloggerCourse.com  Fact Sheet for Healthcare Providers: SeriousBroker.it  This test is not yet approved or cleared by the [place] FDA and has been authorized for detection and/or diagnosis of SARS-CoV-2 by FDA under an Emergency Use Authorization (EUA). This EUA will remain in effect (meaning this test can be used) for the duration of the COVID-19 declaration under Section 564(b)(1) of the Act, 21 U.S.C. section 360bbb-3(b)(1), unless the authorization is terminated or revoked.  Performed at [facility], [address], [place], [place] [address]   Blood culture (routine x 2)     Status: Abnormal   Collection Time: 12/20/23  2:45 PM   Specimen: BLOOD LEFT ARM  Result Value Ref Range Status   Specimen Description   Final    BLOOD LEFT ARM Performed at [facility], [address] [place], [place] [address]    Special Requests   Final    BOTTLES DRAWN AEROBIC AND ANAEROBIC Blood Culture results may not be optimal due to an inadequate volume of blood received in culture bottles Performed at [facility], [address] [place], [place] [address]    Culture  Setup Time   Final    GRAM  POSITIVE COCCI AEROBIC BOTTLE ONLY CRITICAL RESULT CALLED TO, READ BACK BY AND VERIFIED WITH: [facility] CRYSTAL ROBERTSON [id number] AT 1222 BY EC Performed at [facility], [address], [place], [place] [address]    Culture STREPTOCOCCUS MITIS/ORALIS (A)  Final   Report Status 12/23/2023 FINAL  Final   Organism ID, Bacteria STREPTOCOCCUS MITIS/ORALIS  Final      Susceptibility   Streptococcus mitis/oralis - MIC*    PENICILLIN <=0.06 SENSITIVE Sensitive     CEFTRIAXONE <=0.12 SENSITIVE Sensitive     LEVOFLOXACIN 2 SENSITIVE Sensitive     VANCOMYCIN 0.5 SENSITIVE Sensitive     * STREPTOCOCCUS MITIS/ORALIS  Blood Culture ID Panel (Reflexed)     Status: Abnormal   Collection Time: 12/20/23  2:45 PM  Result Value Ref Range Status   Enterococcus faecalis NOT DETECTED NOT DETECTED Final   Enterococcus Faecium NOT DETECTED NOT DETECTED Final   Listeria monocytogenes NOT DETECTED NOT DETECTED Final   Staphylococcus species NOT DETECTED NOT DETECTED Final   Staphylococcus aureus (BCID) NOT DETECTED NOT DETECTED Final   Staphylococcus epidermidis NOT DETECTED NOT DETECTED Final   Staphylococcus lugdunensis NOT DETECTED NOT  DETECTED Final   Streptococcus species DETECTED (A) NOT DETECTED Final    Comment: Not Enterococcus species, Streptococcus agalactiae, Streptococcus pyogenes, or Streptococcus pneumoniae. CRITICAL RESULT CALLED TO, READ BACK BY AND VERIFIED WITH: [facility] CRYSTAL ROBERTSON [id number] AT 1222 BY EC    Streptococcus agalactiae NOT DETECTED NOT DETECTED Final   Streptococcus pneumoniae NOT DETECTED NOT DETECTED Final   Streptococcus pyogenes NOT DETECTED NOT DETECTED Final   A.calcoaceticus-baumannii NOT DETECTED NOT DETECTED Final   Bacteroides fragilis NOT DETECTED NOT DETECTED Final   Enterobacterales NOT DETECTED NOT DETECTED Final   Enterobacter cloacae complex NOT DETECTED NOT DETECTED Final   Escherichia coli NOT DETECTED NOT DETECTED Final   Klebsiella aerogenes  NOT DETECTED NOT DETECTED Final   Klebsiella oxytoca NOT DETECTED NOT DETECTED Final   Klebsiella pneumoniae NOT DETECTED NOT DETECTED Final   Proteus species NOT DETECTED NOT DETECTED Final   Salmonella species NOT DETECTED NOT DETECTED Final   Serratia marcescens NOT DETECTED NOT DETECTED Final   Haemophilus influenzae NOT DETECTED NOT DETECTED Final   Neisseria meningitidis NOT DETECTED NOT DETECTED Final   Pseudomonas aeruginosa NOT DETECTED NOT DETECTED Final   Stenotrophomonas maltophilia NOT DETECTED NOT DETECTED Final   Candida albicans NOT DETECTED NOT DETECTED Final   Candida auris NOT DETECTED NOT DETECTED Final   Candida glabrata NOT DETECTED NOT DETECTED Final   Candida krusei NOT DETECTED NOT DETECTED Final   Candida parapsilosis NOT DETECTED NOT DETECTED Final   Candida tropicalis NOT DETECTED NOT DETECTED Final   Cryptococcus neoformans/gattii NOT DETECTED NOT DETECTED Final    Comment: Performed at [facility], [address], [place], [place] [address]    Impression/Plan:  1. Strep mitis bacteremia - positive in one bottle and no further growth to date. Port removed.  TTE without concerns.  I have requested a TEE If no vegetation on TEE, ok to go out on oral antibiotics for < 2 weeks; if there is a vegetation, that will obviously change that plan  Will stop metronidazole  2.   Neutropenic fever - she has remained afebrile while inpatient.  Neutropenia has resolved.    3.  Diarrhea - largely seems resolved.  No significant diarrhea reported with no test sent.  Will cancel GI panel.

## 2023-12-23 NOTE — Progress Notes (Signed)
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 12/23/2023, 2:16 PM

## 2023-12-23 NOTE — Procedures (Signed)
Interventional Radiology Procedure Note  Procedure: Removal of a right IJ approach single lumen PowerPort.  Primary closure, as the pocket looks good.   Complications: None Recommendations:  - Ok to shower tomorrow - Do not submerge for 7 days - Routine wound care   Signed,  Yvone Neu. Loreta Ave, DO

## 2023-12-23 NOTE — Plan of Care (Signed)
Received a call back from patient's primary oncologist, Larwance Sachs who recommended continuing current care with antibiotics for infection per ID, and outpatient follow-up.  Patient has upcoming appointment on Thursday if she is discharged before that.  Otherwise, they will reschedule a follow-up appointment.

## 2023-12-23 NOTE — Progress Notes (Signed)
   [facility] has been requested to perform a transesophageal echocardiogram on Barbara Nicholson for bacteremia.     The patient does NOT have any absolute or relative contraindications to a Transesophageal Echocardiogram (TEE).  The patient has: No other conditions that may impact this procedure.    After careful review of history and examination, the risks and benefits of transesophageal echocardiogram have been explained including risks of esophageal damage, perforation (1:10,000 risk), bleeding, pharyngeal hematoma as well as other potential complications associated with conscious sedation including aspiration, arrhythmia, respiratory failure and death. Alternatives to treatment were discussed, questions were answered. Patient is willing to proceed.   Signed, Marcelino Duster, [place]  12/23/2023 2:34 PM

## 2023-12-23 NOTE — Progress Notes (Signed)
Patient ID: Barbara Nicholson, female   DOB: Jun 27, 1976, 47 y.o.   MRN: [id number] Request received for port a cath removal in pt secondary to strept  bacteremia (port placed at [facility] on 08/14/23). Details/risks of procedure, incl but not limited to internal bleeding, infection, injury to adjacent structures d/w pt with her understanding and consent. Procedure scheduled for today.

## 2023-12-24 ENCOUNTER — Inpatient Hospital Stay (HOSPITAL_COMMUNITY): Payer: No Typology Code available for payment source | Admitting: Anesthesiology

## 2023-12-24 ENCOUNTER — Encounter (HOSPITAL_COMMUNITY): Payer: Self-pay | Admitting: Family Medicine

## 2023-12-24 ENCOUNTER — Encounter (HOSPITAL_COMMUNITY)
Admission: EM | Disposition: A | Discharge status: Home Health | Payer: Self-pay | Source: Home / Self Care | Attending: Student

## 2023-12-24 ENCOUNTER — Inpatient Hospital Stay (HOSPITAL_COMMUNITY): Payer: No Typology Code available for payment source

## 2023-12-24 DIAGNOSIS — E039 Hypothyroidism, unspecified: Secondary | ICD-10-CM | POA: Diagnosis not present

## 2023-12-24 DIAGNOSIS — R7881 Bacteremia: Secondary | ICD-10-CM

## 2023-12-24 DIAGNOSIS — B955 Unspecified streptococcus as the cause of diseases classified elsewhere: Secondary | ICD-10-CM | POA: Insufficient documentation

## 2023-12-24 DIAGNOSIS — I38 Endocarditis, valve unspecified: Secondary | ICD-10-CM | POA: Diagnosis not present

## 2023-12-24 DIAGNOSIS — C689 Malignant neoplasm of urinary organ, unspecified: Secondary | ICD-10-CM | POA: Diagnosis not present

## 2023-12-24 DIAGNOSIS — D709 Neutropenia, unspecified: Secondary | ICD-10-CM | POA: Diagnosis not present

## 2023-12-24 DIAGNOSIS — E871 Hypo-osmolality and hyponatremia: Secondary | ICD-10-CM | POA: Diagnosis not present

## 2023-12-24 DIAGNOSIS — E876 Hypokalemia: Secondary | ICD-10-CM | POA: Diagnosis not present

## 2023-12-24 HISTORY — PX: TRANSESOPHAGEAL ECHOCARDIOGRAM (CATH LAB): EP1270

## 2023-12-24 LAB — CBC WITH DIFFERENTIAL/PLATELET
Abs Immature Granulocytes: 0.13 10*3/uL — ABNORMAL HIGH (ref 0.00–0.07)
Basophils Absolute: 0 10*3/uL (ref 0.0–0.1)
Basophils Relative: 1 %
Eosinophils Absolute: 0.3 10*3/uL (ref 0.0–0.5)
Eosinophils Relative: 8 %
HCT: 26.9 % — ABNORMAL LOW (ref 36.0–46.0)
Hemoglobin: 8.9 g/dL — ABNORMAL LOW (ref 12.0–15.0)
Immature Granulocytes: 4 %
Lymphocytes Relative: 6 %
Lymphs Abs: 0.2 10*3/uL — ABNORMAL LOW (ref 0.7–4.0)
MCH: 30.8 pg (ref 26.0–34.0)
MCHC: 33.1 g/dL (ref 30.0–36.0)
MCV: 93.1 fL (ref 80.0–100.0)
Monocytes Absolute: 0.3 10*3/uL (ref 0.1–1.0)
Monocytes Relative: 10 %
Neutro Abs: 2.5 10*3/uL (ref 1.7–7.7)
Neutrophils Relative %: 71 %
Platelets: 137 10*3/uL — ABNORMAL LOW (ref 150–400)
RBC: 2.89 MIL/uL — ABNORMAL LOW (ref 3.87–5.11)
RDW: 14.8 % (ref 11.5–15.5)
WBC: 3.5 10*3/uL — ABNORMAL LOW (ref 4.0–10.5)
nRBC: 0 % (ref 0.0–0.2)

## 2023-12-24 LAB — RENAL FUNCTION PANEL
Albumin: 1.9 g/dL — ABNORMAL LOW (ref 3.5–5.0)
Anion gap: 8 (ref 5–15)
BUN: 8 mg/dL (ref 6–20)
CO2: 21 mmol/L — ABNORMAL LOW (ref 22–32)
Calcium: 7.3 mg/dL — ABNORMAL LOW (ref 8.9–10.3)
Chloride: 95 mmol/L — ABNORMAL LOW (ref 98–111)
Creatinine, Ser: 0.58 mg/dL (ref 0.44–1.00)
GFR, Estimated: >60 mL/min (ref >60)
Glucose, Bld: 91 mg/dL (ref 70–99)
Phosphorus: 2.8 mg/dL (ref 2.5–4.6)
Potassium: 3.9 mmol/L (ref 3.5–5.1)
Sodium: 124 mmol/L — ABNORMAL LOW (ref 135–145)

## 2023-12-24 LAB — ECHO TEE

## 2023-12-24 LAB — MAGNESIUM: Magnesium: 1.8 mg/dL (ref 1.7–2.4)

## 2023-12-24 SURGERY — TRANSESOPHAGEAL ECHOCARDIOGRAM (TEE) (CATHLAB)
Anesthesia: Monitor Anesthesia Care

## 2023-12-24 MED ORDER — CEFTRIAXONE IV (FOR PTA / DISCHARGE USE ONLY): 2.0000 g | INTRAVENOUS | 0 refills | Status: DC

## 2023-12-24 MED ORDER — PROPOFOL 10 MG/ML IV BOLUS
INTRAVENOUS | Status: DC | PRN
  Administered 2023-12-24: 50 mg via INTRAVENOUS
  Administered 2023-12-24: 20 mg via INTRAVENOUS

## 2023-12-24 MED ORDER — PROPOFOL 500 MG/50ML IV EMUL
INTRAVENOUS | Status: DC | PRN
  Administered 2023-12-24: 50 ug/kg/min via INTRAVENOUS

## 2023-12-24 MED ORDER — SODIUM CHLORIDE 0.9% FLUSH: 3.0000 mL | INTRAVENOUS | Status: DC | PRN

## 2023-12-24 MED ORDER — CEFADROXIL 500 MG PO CAPS
1000.0000 mg | ORAL_CAPSULE | Freq: Two times a day (BID) | ORAL | Status: DC
  Filled 2023-12-24: qty 2

## 2023-12-24 MED ORDER — SODIUM CHLORIDE 0.9 % IV SOLN
2.0000 g | INTRAVENOUS | Status: DC
  Administered 2023-12-25: 2 g via INTRAVENOUS
  Filled 2023-12-24 (×2): qty 20

## 2023-12-24 MED ORDER — SODIUM CHLORIDE 0.9% FLUSH: 3.0000 mL | Freq: Two times a day (BID) | INTRAVENOUS | Status: DC

## 2023-12-24 MED ORDER — EPHEDRINE SULFATE-NACL 50-0.9 MG/10ML-% IV SOSY
PREFILLED_SYRINGE | INTRAVENOUS | Status: DC | PRN
  Administered 2023-12-24 (×2): 2.5 mg via INTRAVENOUS

## 2023-12-24 MED ORDER — PHENYLEPHRINE 80 MCG/ML (10ML) SYRINGE FOR IV PUSH (FOR BLOOD PRESSURE SUPPORT)
PREFILLED_SYRINGE | INTRAVENOUS | Status: DC | PRN
  Administered 2023-12-24: 80 ug via INTRAVENOUS

## 2023-12-24 MED ORDER — LIDOCAINE 2% (20 MG/ML) 5 ML SYRINGE
INTRAMUSCULAR | Status: DC | PRN
  Administered 2023-12-24: 40 mg via INTRAVENOUS
  Administered 2023-12-24: 60 mg via INTRAVENOUS

## 2023-12-24 MED ORDER — SODIUM CHLORIDE 1 G PO TABS
1.0000 g | ORAL_TABLET | Freq: Three times a day (TID) | ORAL | Status: DC
  Administered 2023-12-24 – 2023-12-25 (×3): 1 g via ORAL
  Filled 2023-12-24 (×3): qty 1

## 2023-12-24 MED ORDER — CEFTRIAXONE IV (FOR PTA / DISCHARGE USE ONLY): 2.0000 g | INTRAVENOUS | 0 refills | Status: AC

## 2023-12-24 MED ORDER — SODIUM CHLORIDE 0.9 % IV SOLN: INTRAVENOUS | Status: DC | PRN

## 2023-12-24 NOTE — Progress Notes (Signed)
 Echocardiogram Echocardiogram Transesophageal has been performed.  Barbara Nicholson 12/24/2023, 11:37 AM

## 2023-12-24 NOTE — Plan of Care (Signed)
  Problem: Education: Goal: Knowledge of General Education information will improve Description: Including pain rating scale, medication(s)/side effects and non-pharmacologic comfort measures Outcome: Progressing   Problem: Health Behavior/Discharge Planning: Goal: Ability to manage health-related needs will improve Outcome: Progressing   Problem: Clinical Measurements: Goal: Cardiovascular complication will be avoided Outcome: Progressing   Problem: Coping: Goal: Level of anxiety will decrease Outcome: Progressing   Problem: Pain Management: Goal: General experience of comfort will improve Outcome: Progressing   Problem: Nutrition: Goal: Adequate nutrition will be maintained Outcome: Not Progressing
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
## 2023-12-24 NOTE — Progress Notes (Signed)
 PHARMACY CONSULT NOTE FOR:  OUTPATIENT  PARENTERAL ANTIBIOTIC THERAPY (OPAT)  Indication: Strep mitis endocarditis  Regimen: Ceftriaxone  2 gm IV q 24 hours  End date: 01/22/24   IV antibiotic discharge orders are pended. To discharging provider:  please sign these orders via discharge navigator,  Select New Orders & click on the button choice - Manage This Unsigned Work.     Thank you for allowing pharmacy to be a part of this patient's care.  Damien Quiet, PharmD, BCPS, BCIDP Infectious Diseases Clinical Pharmacist Phone: [phone] 12/24/2023, 2:49 PM

## 2023-12-24 NOTE — Interval H&P Note (Signed)
 History and Physical Interval Note:  12/24/2023 10:36 AM  Barbara Nicholson  has presented today for surgery, with the diagnosis of bacteremia.  The various methods of treatment have been discussed with the patient and family. After consideration of risks, benefits and other options for treatment, the patient has consented to  Procedure(s): TRANSESOPHAGEAL ECHOCARDIOGRAM (N/A) as a surgical intervention.  The patient's history has been reviewed, patient examined, no change in status, stable for surgery.  I have reviewed the patient's chart and labs.  Questions were answered to the patient's satisfaction.     Vinie JAYSON Maxcy

## 2023-12-24 NOTE — Progress Notes (Signed)
 [facility] here for transport back to [facility].  Tolerated TEE without difficulty, post ope recovery uneventful.  Taking PO's VSS.

## 2023-12-24 NOTE — Progress Notes (Addendum)
 OPAT:  Diagnosis: endocarditis  Culture Result: Strep mitis  Allergies  Allergen Reactions   Piperacillin Sod-Tazobactam So Anaphylaxis and Other (See Comments)    First dose- swollen lips, scratchy throat    OPAT Orders Discharge antibiotics to be given via PICC line Discharge antibiotics: ceftriaxone  2 grams daily IV Per pharmacy protocol yes Duration: 4 weeks End Date: 01/22/24  [facility] Care Per Protocol: yes  Home health RN for IV administration and teaching; PICC line care and labs.    Labs weekly while on IV antibiotics: _x_ CBC with differential __ BMP _x_ CMP __ CRP __ ESR __ Vancomycin  trough __ CK  __ Please pull PIC at completion of IV antibiotics _x_ Please leave PIC in place until doctor has seen patient or been notified  Fax weekly labs to [phone]  Clinic Follow Up Appt: 01/22/24 at 2:30

## 2023-12-24 NOTE — Transfer of Care (Signed)
 Immediate Anesthesia Transfer of Care Note  Patient: Barbara Nicholson  Procedure(s) Performed: TRANSESOPHAGEAL ECHOCARDIOGRAM  Patient Location: PACU and Cath Lab  Anesthesia Type:MAC  Level of Consciousness: awake, alert , and oriented  Airway & Oxygen Therapy: Patient Spontanous Breathing  Post-op Assessment: Report given to RN and Post -op Vital signs reviewed and stable  Post vital signs: Reviewed and stable  Last Vitals:  Vitals Value Taken Time  BP    Temp    Pulse    Resp    SpO2      Last Pain:  Vitals:   12/24/23 1039  TempSrc:   PainSc: 0-No pain      Patients Stated Pain Goal: 0 (12/24/23 0925)  Complications: No notable events documented.

## 2023-12-24 NOTE — Plan of Care (Signed)
   Problem: Education: Goal: Knowledge of General Education information will improve Description: Including pain rating scale, medication(s)/side effects and non-pharmacologic comfort measures Outcome: Progressing   Problem: Activity: Goal: Risk for activity intolerance will decrease Outcome: Progressing   Problem: Coping: Goal: Level of anxiety will decrease Outcome: Progressing   Problem: Elimination: Goal: Will not experience complications related to urinary retention Outcome: Progressing   Problem: Pain Management: Goal: General experience of comfort will improve Outcome: Progressing   Problem: Safety: Goal: Ability to remain free from injury will improve Outcome: Progressing   Problem: Skin Integrity: Goal: Risk for impaired skin integrity will decrease Outcome: Progressing

## 2023-12-24 NOTE — Progress Notes (Addendum)
  Gardiner Barefoot, MD   Addendum:  TEE report corrected to a vegetation so will treat with ceftriaxone for 4 weeks total  Will need picc line

## 2023-12-24 NOTE — Anesthesia Preprocedure Evaluation (Addendum)
 Anesthesia Evaluation  Patient identified by MRN, date of birth, ID band Patient awake    Reviewed: Allergy & Precautions, NPO status , Patient's Chart, lab work & pertinent test results  History of Anesthesia Complications Negative for: history of anesthetic complications  Airway Mallampati: II  TM Distance: >3 FB Neck ROM: Full    Dental no notable dental hx.    Pulmonary neg pulmonary ROS   Pulmonary exam normal        Cardiovascular negative cardio ROS Normal cardiovascular exam     Neuro/Psych   Anxiety        GI/Hepatic Neg liver ROS, PUD,,,  Endo/Other   Hyperthyroidism   Renal/GU Urothelial cancer     Musculoskeletal negative musculoskeletal ROS (+)    Abdominal   Peds  Hematology  (+) Blood dyscrasia (Hgb 8.9, Plt 137k), anemia   Anesthesia Other Findings Bacteremia, Na 124  Reproductive/Obstetrics                             Anesthesia Physical Anesthesia Plan  ASA: 3  Anesthesia Plan: MAC   Post-op Pain Management: Minimal or no pain anticipated   Induction: Intravenous  PONV Risk Score and Plan: 2 and Treatment may vary due to age or medical condition and Propofol  infusion  Airway Management Planned: Natural Airway and Nasal Cannula  Additional Equipment: None  Intra-op Plan:   Post-operative Plan:   Informed Consent: I have reviewed the patients History and Physical, chart, labs and discussed the procedure including the risks, benefits and alternatives for the proposed anesthesia with the patient or authorized representative who has indicated his/her understanding and acceptance.       Plan Discussed with: CRNA  Anesthesia Plan Comments:        Anesthesia Quick Evaluation

## 2023-12-24 NOTE — CV Procedure (Addendum)
 TRANSESOPHAGEAL ECHOCARDIOGRAM (TEE) NOTE  INDICATIONS: infective endocarditis  PROCEDURE:   Informed consent was obtained prior to the procedure. The risks, benefits and alternatives for the procedure were discussed and the patient comprehended these risks.  Risks include, but are not limited to, cough, sore throat, vomiting, nausea, somnolence, esophageal and stomach trauma or perforation, bleeding, low blood pressure, aspiration, pneumonia, infection, trauma to the teeth and death.    After a procedural time-out, the patient was given propofol  for sedation by anesthesia. See their separate report.  The patient's heart rate, blood pressure, and oxygen saturation are monitored continuously during the procedure.The oropharynx was anesthetized with topical cetacaine.  The transesophageal probe was inserted in the esophagus and stomach without difficulty and multiple views were obtained.  The patient was kept under observation until the patient left the procedure room.  I was present face-to-face 100% of this time. The patient left the procedure room in stable condition.   Agitated microbubble saline contrast was not administered.  COMPLICATIONS:    There were no immediate complications.  Findings:  LEFT VENTRICLE: The left ventricular wall thickness is normal.  The left ventricular cavity is normal in size. Wall motion is normal.  LVEF is 60-65%.  RIGHT VENTRICLE:  The right ventricle is normal in structure and function without any thrombus or masses.    LEFT ATRIUM:  The left atrium is normal in size without any thrombus or masses.  There is not spontaneous echo contrast (smoke) in the left atrium consistent with a low flow state.  LEFT ATRIAL APPENDAGE:  The small left atrial appendage is free of any thrombus or masses. The appendage has single lobes. Pulse doppler indicates moderate flow in the appendage.  ATRIAL SEPTUM:  The atrial septum appears intact and is free of thrombus  and/or masses.  There is no evidence for interatrial shunting by color doppler.  RIGHT ATRIUM:  The right atrium is normal in size and function without any thrombus or masses. There appears to be a mobile filamentous structure in the superior vena cava, suggestive of a fibrinous vegetation. Probably related to recently removed indwelling catheter.  MITRAL VALVE:  The mitral valve is normal in structure and function with  trivial  regurgitation.  There were no vegetations or stenosis.  AORTIC VALVE:  The aortic valve is trileaflet, normal in structure and function with  no  regurgitation.  There were no vegetations or stenosis  TRICUSPID VALVE:  The tricuspid valve is normal in structure and function with  trivial  regurgitation.  There were no vegetations or stenosis   PULMONIC VALVE:  The pulmonic valve is normal in structure and function with  trivial  regurgitation.  There were no vegetations or stenosis.   AORTIC ARCH, ASCENDING AND DESCENDING AORTA:  There was no Shaune et. Al, 1992) atherosclerosis of the ascending aorta, aortic arch, or proximal descending aorta.  12. PULMONARY VEINS: Anomalous pulmonary venous return was not noted.  13. PERICARDIUM: The pericardium appeared normal and non-thickened.  There is no pericardial effusion.  IMPRESSION:   There is fibrinous stranding at the SVC/RA junction, which likely represents fibrinous vegetation. Possibly from recently removed indwelling catheter. No LAA Thrombus Negative for PFO LVEF 60-65%, normal wall motion  RECOMMENDATIONS:    Findings communicated to Dr. Efrain. Recommend longer-course of antibiotics for vegetation. Recommend repeat TEE after course of therapy to demonstrate resolution before replacement of another catheter.  Time Spent Directly with the Patient:  45 minutes   Barbara C.  Mona, MD, [facility], FACP  [place]  [facility]  Medical Director of the Advanced Lipid Disorders &  Cardiovascular Risk Reduction  Clinic Diplomate of the American Board of Clinical Lipidology Attending Cardiologist  Direct Dial: [phone]  Fax: [phone]  Website:  [email or web address] Barbara BROCKS Deonte Otting 12/24/2023, 11:46 AM

## 2023-12-24 NOTE — Progress Notes (Addendum)
 Patient was picked up by [facility] to go to [facility] for transesophageal echocardiogram. Patient was stable when picked up by [facility].

## 2023-12-24 NOTE — Anesthesia Postprocedure Evaluation (Signed)
 Anesthesia Post Note  Patient: Barbara Nicholson  Procedure(s) Performed: TRANSESOPHAGEAL ECHOCARDIOGRAM     Patient location during evaluation: PACU Anesthesia Type: MAC Level of consciousness: awake and alert Pain management: pain level controlled Vital Signs Assessment: post-procedure vital signs reviewed and stable Respiratory status: spontaneous breathing, nonlabored ventilation and respiratory function stable Cardiovascular status: blood pressure returned to baseline Postop Assessment: no apparent nausea or vomiting Anesthetic complications: no   No notable events documented.  Last Vitals:  Vitals:   12/24/23 1204 12/24/23 1214  BP: 110/64 109/64  Pulse: 85 71  Resp: 18 18  Temp:    SpO2: 98% 96%    Last Pain:  Vitals:   12/24/23 1214  TempSrc:   PainSc: 0-No pain                 Vertell Row

## 2023-12-25 DIAGNOSIS — C689 Malignant neoplasm of urinary organ, unspecified: Secondary | ICD-10-CM | POA: Diagnosis not present

## 2023-12-25 DIAGNOSIS — D709 Neutropenia, unspecified: Secondary | ICD-10-CM | POA: Diagnosis not present

## 2023-12-25 DIAGNOSIS — B955 Unspecified streptococcus as the cause of diseases classified elsewhere: Secondary | ICD-10-CM

## 2023-12-25 DIAGNOSIS — R7881 Bacteremia: Secondary | ICD-10-CM | POA: Diagnosis not present

## 2023-12-25 DIAGNOSIS — D61818 Other pancytopenia: Secondary | ICD-10-CM | POA: Diagnosis not present

## 2023-12-25 LAB — CULTURE, BLOOD (ROUTINE X 2): Culture: NO GROWTH

## 2023-12-25 LAB — CBC WITH DIFFERENTIAL/PLATELET
Abs Immature Granulocytes: 0.13 10*3/uL — ABNORMAL HIGH (ref 0.00–0.07)
Basophils Absolute: 0 10*3/uL (ref 0.0–0.1)
Basophils Relative: 1 %
Eosinophils Absolute: 0.3 10*3/uL (ref 0.0–0.5)
Eosinophils Relative: 10 %
HCT: 27.5 % — ABNORMAL LOW (ref 36.0–46.0)
Hemoglobin: 8.8 g/dL — ABNORMAL LOW (ref 12.0–15.0)
Immature Granulocytes: 4 %
Lymphocytes Relative: 8 %
Lymphs Abs: 0.3 10*3/uL — ABNORMAL LOW (ref 0.7–4.0)
MCH: 30.3 pg (ref 26.0–34.0)
MCHC: 32 g/dL (ref 30.0–36.0)
MCV: 94.8 fL (ref 80.0–100.0)
Monocytes Absolute: 0.3 10*3/uL (ref 0.1–1.0)
Monocytes Relative: 10 %
Neutro Abs: 2.1 10*3/uL (ref 1.7–7.7)
Neutrophils Relative %: 67 %
Platelets: 150 10*3/uL (ref 150–400)
RBC: 2.9 MIL/uL — ABNORMAL LOW (ref 3.87–5.11)
RDW: 14.4 % (ref 11.5–15.5)
WBC: 3.2 10*3/uL — ABNORMAL LOW (ref 4.0–10.5)
nRBC: 0 % (ref 0.0–0.2)

## 2023-12-25 LAB — RENAL FUNCTION PANEL
Albumin: 2.1 g/dL — ABNORMAL LOW (ref 3.5–5.0)
Anion gap: 10 (ref 5–15)
BUN: 6 mg/dL (ref 6–20)
CO2: 21 mmol/L — ABNORMAL LOW (ref 22–32)
Calcium: 7.6 mg/dL — ABNORMAL LOW (ref 8.9–10.3)
Chloride: 98 mmol/L (ref 98–111)
Creatinine, Ser: 0.47 mg/dL (ref 0.44–1.00)
GFR, Estimated: >60 mL/min (ref >60)
Glucose, Bld: 127 mg/dL — ABNORMAL HIGH (ref 70–99)
Phosphorus: 2.9 mg/dL (ref 2.5–4.6)
Potassium: 3.8 mmol/L (ref 3.5–5.1)
Sodium: 129 mmol/L — ABNORMAL LOW (ref 135–145)

## 2023-12-25 LAB — MAGNESIUM: Magnesium: 1.8 mg/dL (ref 1.7–2.4)

## 2023-12-25 MED ORDER — SODIUM CHLORIDE 1 G PO TABS: 1.0000 g | ORAL_TABLET | Freq: Two times a day (BID) | ORAL | 0 refills | Status: AC

## 2023-12-25 NOTE — Plan of Care (Signed)
  Problem: Education: Goal: Knowledge of General Education information will improve Description: Including pain rating scale, medication(s)/side effects and non-pharmacologic comfort measures Outcome: Adequate for Discharge   

## 2023-12-25 NOTE — TOC Transition Note (Signed)
 Transition of Care [facility]) - Discharge Note   Patient Details  Name: Barbara Nicholson MRN: [id number] Date of Birth: 1976/01/30  [facility] CM/SW Contact:  Tawni CHRISTELLA Eva, LCSW Phone Number: 12/25/2023, 9:44 AM   Clinical Narrative:    Pt to discharge home with IV antibiotics, Pam with [facility] following. [facility] will provide home health RN. Per Pam, teaching was completed and medications have been delivered. No further TOC needs , TOC sign off.    Final next level of care: Home w Home Health Services Barriers to Discharge: No Barriers Identified   Patient Goals and CMS Choice Patient states their goals for this hospitalization and ongoing recovery are:: retrun home          Discharge Placement                    Patient and family notified of of transfer: 12/25/23  Discharge Plan and Services Additional resources added to the After Visit Summary for                                       Social Drivers of Health (SDOH) Interventions SDOH Screenings   Food Insecurity: No Food Insecurity (12/21/2023)  Housing: Low Risk  (12/21/2023)  Transportation Needs: No Transportation Needs (12/21/2023)  Utilities: Not At Risk (12/21/2023)  Financial Resource Strain: Low Risk  (01/28/2023)   Received from [facility]  Tobacco Use: Low Risk  (12/24/2023)     Readmission Risk Interventions     No data to display

## 2023-12-25 NOTE — Discharge Summary (Signed)
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 communicate with the lumen of the colon. More proximal transverse colon appears within normal limits. The ascending colon and cecum demonstrates significant wall thickening with mucosal enhancement. These changes may be reactive in nature to the adjacent peritoneal and serosal implants adjacent to the sigmoid colon. The small bowel shows no obstructive changes. Stomach is within normal limits. Vascular/Lymphatic: Atherosclerotic calcifications are seen. A cystic lesion is again noted in the left periaortic space measuring 5.0 x 4.4 cm roughly stable from the prior exam with a small intra-aortocaval component previously described. Reproductive: Uterus has been surgically removed. No adnexal mass is noted. Other: Multiple peripherally enhancing peritoneal implants are noted along the dome of the bladder which by a report are stable. Musculoskeletal: No acute or significant osseous findings. IMPRESSION: Extensive metastatic disease within the left renal bed as well as diffusely throughout the peritoneum as described. These appear relatively  stable when compared with the prior exam although 1 lesion along the posterior aspect of the descending colon appears increased in size with apparent invasion into the lumen of the descending colon. It now measures up to 4.6 cm and demonstrates air and fluid within which may be related to fecal material. It appears otherwise contained. Considerable wall thickening in the ascending colon and cecum. This likely represents focal colitis new from the prior exam. Some adjacent enhancing peritoneal implants are noted near the cecum and invasion could not be totally excluded. Electronically Signed   By: Oneil Devonshire M.D.   On: 12/20/2023 17:56   DG Chest Portable 1 View Result Date: 12/20/2023 CLINICAL DATA:  Fever, chills, shortness of breath, and weakness. History of metastatic renal cell cancer. EXAM: PORTABLE CHEST 1 VIEW COMPARISON:  PET-CT dated October 25, 2022. FINDINGS: Right chest wall port catheter with tip in the proximal right atrium. The heart size and mediastinal contours are within normal limits. Normal pulmonary vascularity. No focal consolidation, pleural effusion, or pneumothorax. No acute osseous abnormality. IMPRESSION: 1. No active disease. Electronically Signed   By: Elsie ONEIDA Shoulder M.D.   On: 12/20/2023 16:08       The results of significant diagnostics from this hospitalization (including imaging, microbiology, ancillary and laboratory) are listed below for reference.     Microbiology: Recent Results (from the past 240 hours)  Blood culture (routine x 2)     Status: None   Collection Time: 12/20/23  2:29 PM   Specimen: BLOOD LEFT ARM  Result Value Ref Range Status   Specimen Description   Final    BLOOD LEFT ARM Performed at [facility], [address] [place], [place] [address]    Special Requests   Final    BOTTLES DRAWN AEROBIC AND ANAEROBIC Blood Culture results may not be optimal due to an inadequate volume of blood received in culture bottles Performed  at  [facility], [address] [place], [place] [address]    Culture   Final    NO GROWTH 5 DAYS Performed at [facility], [address], [place], [place] [address]    Report Status 12/25/2023 FINAL  Final  Resp panel by RT-PCR (RSV, Flu A&B, Covid) Anterior Nasal Swab     Status: None   Collection Time: 12/20/23  2:29 PM   Specimen: Anterior Nasal Swab  Result Value Ref Range Status   SARS Coronavirus 2 by RT PCR NEGATIVE NEGATIVE Final    Comment: (NOTE) SARS-CoV-2 target nucleic acids are NOT DETECTED.  The SARS-CoV-2 RNA is generally detectable in upper respiratory specimens during the acute phase of infection. The lowest concentration of SARS-CoV-2 viral copies this assay can detect is 138 copies/mL. A negative result does not preclude SARS-Cov-2 infection and should not be used as the sole basis for treatment or other patient management decisions. A negative result may occur with  improper specimen collection/handling, submission of specimen other than nasopharyngeal swab, presence of viral mutation(s) within the areas targeted by this assay, and inadequate number of viral copies(<138 copies/mL). A negative result must be combined with clinical observations, patient history, and epidemiological information. The expected result is Negative.  Fact Sheet for Patients:  bloggercourse.com  Fact Sheet for Healthcare Providers:  seriousbroker.it  This test is no t yet approved or cleared by the [place]  FDA and  has been authorized for detection and/or diagnosis of SARS-CoV-2 by FDA under an Emergency Use Authorization (EUA). This EUA will remain  in effect (meaning this test can be used) for the duration of the COVID-19 declaration under Section 564(b)(1) of the Act, 21 U.S.C.section 360bbb-3(b)(1), unless the authorization is terminated  or revoked sooner.       Influenza A by PCR NEGATIVE  NEGATIVE Final   Influenza B by PCR NEGATIVE NEGATIVE Final    Comment: (NOTE) The Xpert Xpress SARS-CoV-2/FLU/RSV plus assay is intended as an aid in the diagnosis of influenza from Nasopharyngeal swab specimens and should not be used as a sole basis for treatment. Nasal washings and aspirates are unacceptable for Xpert Xpress SARS-CoV-2/FLU/RSV testing.  Fact Sheet for Patients: bloggercourse.com  Fact Sheet for Healthcare Providers: seriousbroker.it  This test is not yet approved or cleared by the [place]  FDA and has been authorized for detection and/or diagnosis of SARS-CoV-2 by FDA under an Emergency Use Authorization (EUA). This EUA will remain in effect (meaning this test can be used) for the duration of the COVID-19 declaration under Section 564(b)(1) of the Act, 21 U.S.C. section 360bbb-3(b)(1), unless the authorization is terminated or revoked.     Resp Syncytial Virus by PCR NEGATIVE NEGATIVE Final    Comment: (NOTE) Fact Sheet for Patients: bloggercourse.com  Fact Sheet for Healthcare Providers: seriousbroker.it  This test is not yet approved or cleared by the [place]  FDA and has been authorized for detection and/or diagnosis of SARS-CoV-2 by FDA under an Emergency Use Authorization (EUA). This EUA will remain in effect (meaning this test can be used) for the duration of the COVID-19 declaration under Section 564(b)(1) of the Act, 21 U.S.C. section 360bbb-3(b)(1), unless the authorization is terminated or revoked.  Performed at [facility], [address], [place], [place] [address]   Blood culture (routine x 2)     Status: Abnormal   Collection Time: 12/20/23  2:45 PM   Specimen: BLOOD LEFT ARM  Result Value Ref Range  Status   Specimen Description   Final    BLOOD LEFT ARM Performed at [facility], [address],  [place], [place] [address]    Special Requests   Final    BOTTLES DRAWN AEROBIC AND ANAEROBIC Blood Culture results may not be optimal due to an inadequate volume of blood received in culture bottles Performed at [facility], [address], [place], [place] [address]    Culture  Setup Time   Final    GRAM POSITIVE COCCI AEROBIC BOTTLE ONLY CRITICAL RESULT CALLED TO, READ BACK BY AND VERIFIED WITH: [facility] CRYSTAL ROBERTSON [id number] AT 1222 BY EC Performed at [facility], [address], [place], [place] [address]    Culture STREPTOCOCCUS MITIS/ORALIS (A)  Final   Report Status 12/23/2023 FINAL  Final   Organism ID, Bacteria STREPTOCOCCUS MITIS/ORALIS  Final      Susceptibility   Streptococcus mitis/oralis - MIC*    PENICILLIN <=0.06 SENSITIVE Sensitive     CEFTRIAXONE  <=0.12 SENSITIVE Sensitive     LEVOFLOXACIN 2 SENSITIVE Sensitive     VANCOMYCIN  0.5 SENSITIVE Sensitive     * STREPTOCOCCUS MITIS/ORALIS  Blood Culture ID Panel (Reflexed)     Status: Abnormal   Collection Time: 12/20/23  2:45 PM  Result Value Ref Range Status   Enterococcus faecalis NOT DETECTED NOT DETECTED Final   Enterococcus Faecium NOT DETECTED NOT DETECTED Final   Listeria monocytogenes NOT DETECTED NOT DETECTED Final   Staphylococcus species NOT DETECTED NOT DETECTED Final   Staphylococcus aureus (BCID) NOT DETECTED NOT DETECTED Final   Staphylococcus epidermidis NOT DETECTED NOT DETECTED Final   Staphylococcus lugdunensis NOT DETECTED NOT DETECTED Final   Streptococcus species DETECTED (A) NOT DETECTED Final    Comment: Not Enterococcus species, Streptococcus agalactiae, Streptococcus pyogenes, or Streptococcus pneumoniae. CRITICAL RESULT CALLED TO, READ BACK BY AND VERIFIED WITH: [facility] CRYSTAL ROBERTSON [id number] AT 1222 BY EC    Streptococcus agalactiae NOT DETECTED NOT DETECTED Final   Streptococcus pneumoniae NOT DETECTED NOT DETECTED Final   Streptococcus pyogenes NOT DETECTED  NOT DETECTED Final   A.calcoaceticus-baumannii NOT DETECTED NOT DETECTED Final   Bacteroides fragilis NOT DETECTED NOT DETECTED Final   Enterobacterales NOT DETECTED NOT DETECTED Final   Enterobacter cloacae complex NOT DETECTED NOT DETECTED Final   Escherichia coli NOT DETECTED NOT DETECTED Final   Klebsiella aerogenes NOT DETECTED NOT DETECTED Final   Klebsiella oxytoca NOT DETECTED NOT DETECTED Final   Klebsiella pneumoniae NOT DETECTED NOT DETECTED Final   Proteus species NOT DETECTED NOT DETECTED Final   Salmonella species NOT DETECTED NOT DETECTED Final   Serratia marcescens NOT DETECTED NOT DETECTED Final   Haemophilus influenzae NOT DETECTED NOT DETECTED Final   Neisseria meningitidis NOT DETECTED NOT DETECTED Final   Pseudomonas aeruginosa NOT DETECTED NOT DETECTED Final   Stenotrophomonas maltophilia NOT DETECTED NOT DETECTED Final   Candida albicans NOT DETECTED NOT DETECTED Final   Candida auris NOT DETECTED NOT DETECTED Final   Candida glabrata NOT DETECTED NOT DETECTED Final   Candida krusei NOT DETECTED NOT DETECTED Final   Candida parapsilosis NOT DETECTED NOT DETECTED Final   Candida tropicalis NOT DETECTED NOT DETECTED Final   Cryptococcus neoformans/gattii NOT DETECTED NOT DETECTED Final    Comment: Performed at [facility], [address] [place], [place] [address]  Culture, blood (Routine X 2) w Reflex to ID Panel     Status: None (Preliminary result)   Collection Time: 12/22/23 10:16  AM   Specimen: BLOOD  Result Value Ref Range Status   Specimen Description   Final    BLOOD BLOOD RIGHT ARM Performed at [facility], [address], [place], [place] [address]    Special Requests   Final    BOTTLES DRAWN AEROBIC AND ANAEROBIC Blood Culture adequate volume Performed at [facility], [address], [place], [place] [address]    Culture   Final    NO GROWTH 3 DAYS Performed at [facility], [address], [place], [place] [address]    Report Status PENDING  Incomplete  Culture, blood (Routine X 2) w Reflex to ID Panel     Status: None (Preliminary result)   Collection Time: 12/22/23 10:16 AM   Specimen: BLOOD  Result Value Ref Range Status   Specimen Description   Final    BLOOD BLOOD LEFT ARM Performed at [facility], [address] [place], [place] [address]    Special Requests   Final    BOTTLES DRAWN AEROBIC AND ANAEROBIC Blood Culture adequate volume Performed at [facility], [address], [place], [place] [address]    Culture   Final    NO GROWTH 3 DAYS Performed at [facility], [address] [place], [place] [address]    Report Status PENDING  Incomplete     Labs:  CBC: Recent Labs  Lab 12/21/23 1645 12/22/23 0317 12/23/23 0306 12/24/23 0346 12/25/23 0415  WBC 1.2* 1.9* 3.4* 3.5* 3.2*  NEUTROABS 0.6* 1.2* 2.3 2.5 2.1  HGB 8.8* 8.3* 8.0* 8.9* 8.8*  HCT 26.0* 23.0* 23.5* 26.9* 27.5*  MCV 85.8 85.5 88.7 93.1 94.8  PLT 167 138* 129* 137* 150   BMP &GFR Recent Labs  Lab 12/21/23 0346 12/21/23 1645 12/22/23 0317 12/23/23 0306 12/24/23 0346 12/25/23 0415  NA 120* 129* 122* 125* 124* 129*  K 2.9* 4.3 3.9 3.8 3.9 3.8  CL 91* 99 94* 96* 95* 98  CO2 21* 22 21* 24 21* 21*  GLUCOSE 124* 109* 97 97 91 127*  BUN 20 14 13 11 8 6   CREATININE 0.81 0.70 0.50 0.66 0.58 0.47  CALCIUM 7.4* 7.7* 7.4* 7.1* 7.3* 7.6*  MG 2.0  --  1.7 1.7 1.8 1.8  PHOS  --  1.5*  --  1.9* 2.8 2.9   Estimated Creatinine Clearance: 75.1 mL/min (by C-G formula based on SCr of 0.47 mg/dL). Liver & Pancreas: Recent Labs  Lab 12/20/23 1429 12/21/23 0346 12/21/23 1645 12/22/23 0317 12/23/23 0306 12/24/23 0346 12/25/23 0415  AST 5* <5*  --  <5*  --   --   --   ALT 14 13  --  9  --   --   --   ALKPHOS 85 74  --  64  --   --   --   BILITOT 1.7* 1.4*  --  0.8  --   --   --   PROT 5.5* 4.6*  --  4.4*  --   --   --   ALBUMIN 2.2* 1.7* 1.8*  1.8* 1.7* 1.9* 2.1*   Recent Labs  Lab 12/20/23 1429  LIPASE 18   No results for input(s): AMMONIA in the last 168 hours. Diabetic: No results for input(s): HGBA1C in the last 72 hours. No results for input(s): GLUCAP in the last 168 hours. Cardiac Enzymes: No results for input(s): CKTOTAL, CKMB, CKMBINDEX, TROPONINI in the last 168 hours. No results  for input(s): PROBNP in the last 8760 hours. Coagulation Profile: Recent Labs  Lab 12/20/23 1429  INR 1.7*   Thyroid  Function Tests: No results for input(s): TSH, T4TOTAL, FREET4, T3FREE, THYROIDAB in the last 72 hours. Lipid Profile: No results for input(s): CHOL, HDL, LDLCALC, TRIG, CHOLHDL, LDLDIRECT in the last 72 hours. Anemia Panel: No results for input(s): VITAMINB12, FOLATE, FERRITIN, TIBC, IRON, RETICCTPCT in the last 72 hours. Urine analysis:    Component Value Date/Time   COLORURINE AMBER (A) 12/20/2023 1551   APPEARANCEUR CLEAR 12/20/2023 1551   LABSPEC 1.016 12/20/2023 1551   PHURINE 5.0 12/20/2023 1551   GLUCOSEU NEGATIVE 12/20/2023 1551   HGBUR NEGATIVE 12/20/2023 1551   BILIRUBINUR NEGATIVE 12/20/2023 1551   KETONESUR NEGATIVE 12/20/2023 1551   PROTEINUR 30 (A) 12/20/2023 1551   NITRITE NEGATIVE 12/20/2023 1551   LEUKOCYTESUR NEGATIVE 12/20/2023 1551   Sepsis Labs: Invalid input(s): PROCALCITONIN, LACTICIDVEN   SIGNED:  Dameir Gentzler T Danzel Marszalek, MD  [facility] 12/25/2023, 2:22 PM

## 2023-12-25 NOTE — Progress Notes (Signed)
 Peripherally Inserted Central Catheter Placement  The IV Nurse has discussed with the patient and/or persons authorized to consent for the patient, the purpose of this procedure and the potential benefits and risks involved with this procedure.  The benefits include less needle sticks, lab draws from the catheter, and the patient may be discharged home with the catheter. Risks include, but not limited to, infection, bleeding, blood clot (thrombus formation), and puncture of an artery; nerve damage and irregular heartbeat and possibility to perform a PICC exchange if needed/ordered by physician.  Alternatives to this procedure were also discussed.  Bard Power PICC patient education guide, fact sheet on infection prevention and patient information card has been provided to patient /or left at bedside.    PICC Placement Documentation  PICC Single Lumen 12/25/23 Left Basilic 42 cm 0 cm (Active)  Indication for Insertion or Continuance of Line Prolonged intravenous therapies 12/25/23 1448  Exposed Catheter (cm) 0 cm 12/25/23 1448  Site Assessment Clean, Dry, Intact 12/25/23 1448  Line Status Flushed;Blood return noted;Saline locked 12/25/23 1448  Dressing Type Transparent 12/25/23 1448  Dressing Status Antimicrobial disc in place 12/25/23 1448  Line Care Connections checked and tightened 12/25/23 1448  Line Adjustment (NICU/IV Team Only) No 12/25/23 1448  Dressing Intervention New dressing 12/25/23 1448  Dressing Change Due 01/01/24 12/25/23 1448       Barbara Nicholson 12/25/2023, 3:08 PM

## 2023-12-26 ENCOUNTER — Encounter (HOSPITAL_COMMUNITY): Payer: Self-pay | Admitting: Internal Medicine

## 2023-12-26 ENCOUNTER — Other Ambulatory Visit: Payer: Self-pay

## 2023-12-27 LAB — CULTURE, BLOOD (ROUTINE X 2)
Culture: NO GROWTH
Culture: NO GROWTH
Special Requests: ADEQUATE
Special Requests: ADEQUATE

## 2024-01-22 ENCOUNTER — Ambulatory Visit: Payer: No Typology Code available for payment source | Admitting: Internal Medicine

## 2024-01-29 ENCOUNTER — Encounter (HOSPITAL_COMMUNITY): Payer: Self-pay | Admitting: Emergency Medicine

## 2024-01-29 ENCOUNTER — Emergency Department (HOSPITAL_COMMUNITY)
Admission: EM | Admit: 2024-01-29 | Discharge: 2024-01-30 | Discharge status: Against Medical Advice | Payer: No Typology Code available for payment source | Attending: Emergency Medicine | Admitting: Emergency Medicine

## 2024-01-29 ENCOUNTER — Emergency Department (HOSPITAL_COMMUNITY): Payer: No Typology Code available for payment source

## 2024-01-29 DIAGNOSIS — K529 Noninfective gastroenteritis and colitis, unspecified: Secondary | ICD-10-CM | POA: Insufficient documentation

## 2024-01-29 DIAGNOSIS — C7911 Secondary malignant neoplasm of bladder: Secondary | ICD-10-CM | POA: Diagnosis not present

## 2024-01-29 DIAGNOSIS — Z20822 Contact with and (suspected) exposure to covid-19: Secondary | ICD-10-CM | POA: Diagnosis not present

## 2024-01-29 DIAGNOSIS — C799 Secondary malignant neoplasm of unspecified site: Secondary | ICD-10-CM

## 2024-01-29 DIAGNOSIS — R109 Unspecified abdominal pain: Secondary | ICD-10-CM | POA: Diagnosis present

## 2024-01-29 LAB — CBC WITH DIFFERENTIAL/PLATELET
Abs Immature Granulocytes: 0.05 10*3/uL (ref 0.00–0.07)
Basophils Absolute: 0 10*3/uL (ref 0.0–0.1)
Basophils Relative: 0 %
Eosinophils Absolute: 0.5 10*3/uL (ref 0.0–0.5)
Eosinophils Relative: 4 %
HCT: 33.5 % — ABNORMAL LOW (ref 36.0–46.0)
Hemoglobin: 10.7 g/dL — ABNORMAL LOW (ref 12.0–15.0)
Immature Granulocytes: 0 %
Lymphocytes Relative: 7 %
Lymphs Abs: 0.8 10*3/uL (ref 0.7–4.0)
MCH: 29.6 pg (ref 26.0–34.0)
MCHC: 31.9 g/dL (ref 30.0–36.0)
MCV: 92.8 fL (ref 80.0–100.0)
Monocytes Absolute: 1 10*3/uL (ref 0.1–1.0)
Monocytes Relative: 8 %
Neutro Abs: 9.9 10*3/uL — ABNORMAL HIGH (ref 1.7–7.7)
Neutrophils Relative %: 81 %
Platelets: 321 10*3/uL (ref 150–400)
RBC: 3.61 MIL/uL — ABNORMAL LOW (ref 3.87–5.11)
RDW: 15.1 % (ref 11.5–15.5)
WBC: 12.3 10*3/uL — ABNORMAL HIGH (ref 4.0–10.5)
nRBC: 0 % (ref 0.0–0.2)

## 2024-01-29 LAB — COMPREHENSIVE METABOLIC PANEL
ALT: 10 U/L (ref 0–44)
AST: 10 U/L — ABNORMAL LOW (ref 15–41)
Albumin: 3.2 g/dL — ABNORMAL LOW (ref 3.5–5.0)
Alkaline Phosphatase: 50 U/L (ref 38–126)
Anion gap: 9 (ref 5–15)
BUN: 16 mg/dL (ref 6–20)
CO2: 24 mmol/L (ref 22–32)
Calcium: 8.6 mg/dL — ABNORMAL LOW (ref 8.9–10.3)
Chloride: 101 mmol/L (ref 98–111)
Creatinine, Ser: 1.13 mg/dL — ABNORMAL HIGH (ref 0.44–1.00)
GFR, Estimated: >60 mL/min (ref >60)
Glucose, Bld: 121 mg/dL — ABNORMAL HIGH (ref 70–99)
Potassium: 3.9 mmol/L (ref 3.5–5.1)
Sodium: 134 mmol/L — ABNORMAL LOW (ref 135–145)
Total Bilirubin: 0.9 mg/dL (ref 0.0–1.2)
Total Protein: 6.5 g/dL (ref 6.5–8.1)

## 2024-01-29 LAB — URINALYSIS, W/ REFLEX TO CULTURE (INFECTION SUSPECTED)
Bilirubin Urine: NEGATIVE
Glucose, UA: NEGATIVE mg/dL
Ketones, ur: NEGATIVE mg/dL
Nitrite: NEGATIVE
Protein, ur: 30 mg/dL — AB
Specific Gravity, Urine: 1.018 (ref 1.005–1.030)
pH: 5 (ref 5.0–8.0)

## 2024-01-29 LAB — I-STAT CG4 LACTIC ACID, ED
Lactic Acid, Venous: 1.4 mmol/L (ref 0.5–1.9)
Lactic Acid, Venous: <0.3 mmol/L — ABNORMAL LOW (ref 0.5–1.9)

## 2024-01-29 LAB — PROTIME-INR
INR: 1.2 (ref 0.8–1.2)
Prothrombin Time: 15.2 s (ref 11.4–15.2)

## 2024-01-29 LAB — RESP PANEL BY RT-PCR (RSV, FLU A&B, COVID)  RVPGX2
Influenza A by PCR: NEGATIVE
Influenza B by PCR: NEGATIVE
Resp Syncytial Virus by PCR: NEGATIVE
SARS Coronavirus 2 by RT PCR: NEGATIVE

## 2024-01-29 LAB — APTT: aPTT: 34 s (ref 24–36)

## 2024-01-29 LAB — LIPASE, BLOOD: Lipase: 28 U/L (ref 11–51)

## 2024-01-29 MED ORDER — VANCOMYCIN HCL IN DEXTROSE 1-5 GM/200ML-% IV SOLN
1000.0000 mg | Freq: Once | INTRAVENOUS | Status: AC
  Administered 2024-01-29: 1000 mg via INTRAVENOUS
  Filled 2024-01-29: qty 200

## 2024-01-29 MED ORDER — SODIUM CHLORIDE 0.9 % IV SOLN
2.0000 g | Freq: Once | INTRAVENOUS | Status: AC
  Administered 2024-01-29: 2 g via INTRAVENOUS
  Filled 2024-01-29: qty 12.5

## 2024-01-29 MED ORDER — SODIUM CHLORIDE 0.9 % IV BOLUS
2000.0000 mL | Freq: Once | INTRAVENOUS | Status: AC
  Administered 2024-01-29: 2000 mL via INTRAVENOUS

## 2024-01-29 MED ORDER — METRONIDAZOLE 500 MG/100ML IV SOLN
500.0000 mg | Freq: Once | INTRAVENOUS | Status: AC
  Administered 2024-01-29: 500 mg via INTRAVENOUS
  Filled 2024-01-29: qty 100

## 2024-01-29 MED ORDER — IOHEXOL 300 MG/ML  SOLN
100.0000 mL | Freq: Once | INTRAMUSCULAR | Status: AC | PRN
  Administered 2024-01-30: 100 mL via INTRAVENOUS

## 2024-01-29 NOTE — ED Triage Notes (Signed)
 BIBA Per EMS: pt coming from home. Started feeling unwell yesterday. Spiked a fever of 102.9 at home & gave tylenol . Pt feels lethargic, malaise, confusion. A&O x 2. Usually A&O x 4. Hx stage 4 bladder cancer.  20G L hand. 500cc fluids given en route.  98/64  HR 90 16 RR 99% RA CBG 132

## 2024-01-29 NOTE — ED Provider Notes (Cosign Needed)
 [place] EMERGENCY DEPARTMENT AT [facility] Provider Note   CSN: [id number] Arrival date & time: 01/29/24  1929     History  Chief Complaint  Patient presents with   Fever    Barbara Nicholson is a 48 y.o. female.  Patient to ED with husband who provides history. She has a history of urothelial cancer, s/p nephrectomy, now with Drop metastasis, retroperitoneal and peritoneal metastasis. She had a CT abd/pel done at [facility] 1/13 showing fistulization of a left retroperitoneal mets into the distal/sigmoid colon. She has been doing well until 3-4 days ago when she had increasing fatigue, sleeping, decreased appetite. Today husband found her to be febrile to 103. No vomiting. No diarrhea. He notes she did say she has been having difficulty urinating.   The history is provided by the patient. No language interpreter was used.  Fever      Home Medications Prior to Admission medications   Medication Sig Start Date End Date Taking? Authorizing Provider  ALPRAZolam (XANAX XR) 2 MG 24 hr tablet Take 2 mg by mouth 2 (two) times daily as needed (Anxiety). 01/28/24  Yes [provider]  lenvatinib 14 mg daily dose (LENVIMA, 14 MG DAILY DOSE,) 10 & 4 MG capsule Take 14 mg by mouth daily. 01/08/24 07/07/24 Yes [provider]  LORazepam  (ATIVAN ) 1 MG tablet Take 1 mg by mouth daily as needed for anxiety. 11/13/23  Yes [provider]  methadone  (DOLOPHINE ) 5 MG tablet Take 5 mg by mouth. 01/10/24 02/09/24 Yes [provider]  ondansetron  (ZOFRAN ) 8 MG tablet Take 8 mg by mouth 2 (two) times daily as needed for nausea or vomiting. 11/29/23  Yes [provider]  oxycodone  (ROXICODONE ) 30 MG immediate release tablet Take 30 mg by mouth every 6 (six) hours as needed for pain. 11/27/23  Yes [provider]  promethazine  (PHENERGAN ) 25 MG tablet Take 25 mg by mouth every 6 (six) hours as needed for nausea or vomiting. 11/29/23  Yes [provider]  dexamethasone  (DECADRON ) 4 MG tablet Take 4 mg by mouth See admin instructions. Take 2 tablets (8mg ) by mouth in the morning for 2 days after each treatment day Patient not taking: Reported on 01/29/2024 07/16/23   [provider]  lidocaine -prilocaine (EMLA) cream Apply 1 Application topically See admin instructions. Apply topically once daily as needed. Apply to port site 30-60 min before access. 12/17/23   [provider]      Allergies    Piperacillin sod-tazobactam so    Review of Systems   Review of Systems  Constitutional:  Positive for fever.    Physical Exam Updated Vital Signs BP 90/63 (BP Location: Left Arm)   Pulse 61   Temp 98.8 F (37.1 C) (Oral)   Resp 11   LMP 03/16/2022 (Approximate)   SpO2 100%  Physical Exam Vitals and nursing note reviewed.  Constitutional:      Appearance: She is well-developed.     Comments: Responds to verbal stimuli but does not wake fully  HENT:     Head: Normocephalic.     Mouth/Throat:     Mouth: Mucous membranes are dry.  Cardiovascular:     Rate and Rhythm: Normal rate and regular rhythm.     Heart sounds: No murmur heard. Pulmonary:     Effort: Pulmonary effort is normal.     Breath sounds: Normal breath sounds. No wheezing, rhonchi or rales.     Comments: Poor effort Abdominal:  Palpations: Abdomen is soft. There is no mass.     Tenderness: There is no guarding or rebound.  Musculoskeletal:        General: Normal range of motion.     Cervical back: Normal range of motion and neck supple.  Skin:    General: Skin is warm and dry.     Coloration: Skin is pale.  Neurological:     Comments: Unable to test.      ED Results / Procedures / Treatments   Labs (all labs ordered are listed, but only abnormal results are displayed) Labs Reviewed  CBC WITH DIFFERENTIAL/PLATELET - Abnormal; Notable for the following components:      Result Value   WBC 12.3 (*)    RBC 3.61 (*)    Hemoglobin 10.7 (*)     HCT 33.5 (*)    Neutro Abs 9.9 (*)    All other components within normal limits  COMPREHENSIVE METABOLIC PANEL - Abnormal; Notable for the following components:   Sodium 134 (*)    Glucose, Bld 121 (*)    Creatinine, Ser 1.13 (*)    Calcium 8.6 (*)    Albumin 3.2 (*)    AST 10 (*)    All other components within normal limits  URINALYSIS, W/ REFLEX TO CULTURE (INFECTION SUSPECTED) - Abnormal; Notable for the following components:   Color, Urine AMBER (*)    APPearance CLOUDY (*)    Hgb urine dipstick MODERATE (*)    Protein, ur 30 (*)    Leukocytes,Ua SMALL (*)    Bacteria, UA RARE (*)    All other components within normal limits  I-STAT CG4 LACTIC ACID, ED - Abnormal; Notable for the following components:   Lactic Acid, Venous <0.3 (*)    All other components within normal limits  RESP PANEL BY RT-PCR (RSV, FLU A&B, COVID)  RVPGX2  CULTURE, BLOOD (ROUTINE X 2)  CULTURE, BLOOD (ROUTINE X 2)  URINE CULTURE  LIPASE, BLOOD  PROTIME-INR  APTT  I-STAT CG4 LACTIC ACID, ED    EKG EKG Interpretation Date/Time:  Wednesday January 29 2024 21:02:45 EST Ventricular Rate:  68 PR Interval:  126 QRS Duration:  105 QT Interval:  468 QTC Calculation: 498 R Axis:   41  Text Interpretation: Sinus rhythm Borderline T wave abnormalities Borderline prolonged QT interval No acute changes No significant change since last tracing Confirmed by Charlyn Sora ([id number]) on 01/29/2024 10:04:31 PM  Radiology DG Chest Port 1 View Result Date: 01/29/2024 CLINICAL DATA:  Sepsis EXAM: PORTABLE CHEST 1 VIEW COMPARISON:  None Available. FINDINGS: The heart size and mediastinal contours are within normal limits. Both lungs are clear. The visualized skeletal structures are unremarkable. IMPRESSION: No active disease. Electronically Signed   By: Dorethia Molt M.D.   On: 01/29/2024 20:56    Procedures Procedures    Medications Ordered in ED Medications  sodium chloride  0.9 % bolus 2,000 mL (0 mLs  Intravenous Stopped 01/29/24 2257)  metroNIDAZOLE  (FLAGYL ) IVPB 500 mg (500 mg Intravenous New Bag/Given 01/29/24 2234)  vancomycin  (VANCOCIN ) IVPB 1000 mg/200 mL premix (0 mg Intravenous Stopped 01/29/24 2257)  ceFEPIme  (MAXIPIME ) 2 g in sodium chloride  0.9 % 100 mL IVPB (0 g Intravenous Stopped 01/29/24 2225)  iohexol  (OMNIPAQUE ) 300 MG/ML solution 100 mL (100 mLs Intravenous Contrast Given 01/30/24 0003)    ED Course/ Medical Decision Making/ A&P  Medical Decision Making Patient with known CA with mets through abdomen and Drop mets presents with 3-4 days increasing fatigue, today with fever to 103.   Patient is significantly somnolent. Per husband, has had Alprazolam 2 mg 3 hours prior to arrival. Also take oxycodone  30 mg and methadone  5 mg. She had been complaining of worsening abdominal pain.    01/06/24: CT C/A/P ([facility]) Impression: 1. Continued increase in size of multiple left retroperitoneal and peritoneal metastases. A left retroperitoneal metastasis appears to have fistulized with the adjacent descending/sigmoid colon lumen. 2. Unchanged small pulmonary nodules. 3. Ascending colon wall thickening. Correlate for colitis.  Assessment/Recommendations: Pt with metastatic urothelial cancer with fistulization between tumor and bowel. These can be difficult to treat as we cannot obtain source control. 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 Would refrain from prophylactic antibiotics for now given that she has not experienced systemic infection due to this process.  - Hold off on prophylactic antibiotics unless experiences sepsis (fever, tachycardia, leukocytosis, abdominal pain) as a result of intraabdominal collection. I would be cautious to put on prophylactic Augmentin  given likelihood of developing resistance - Given that the fluid collection is a result of her  tumor and has direct connection to her bowel, would not give course of IV antibiotics given lack of benefit (will not be able to reduce size or sterilize the collection.  - If conceivable that tumor will shrink and fistula will be closed, would pursue percutaneous drainage of collection. Would not do so beforehand as will result in enterocutaneous fistula - If experiences admission due to this collection, this would shift risk/benefit of prophylactic antibiotics - Will arrange in-person ID clinic visit to discuss risks/benefits in person   Patient to be admitted. Given broad spectrum abx per pharmacy. CT pending at time of sign out at end of shift, to [employer], PA-C.    Amount and/or Complexity of Data Reviewed Labs: ordered. Radiology: ordered.  Risk Prescription drug management.           Final Clinical Impression(s) / ED Diagnoses Final diagnoses:  None    Rx / DC Orders ED Discharge Orders     None         Odell Balls, PA-C 01/30/24 0028

## 2024-01-29 NOTE — ED Provider Notes (Signed)
 Patient given in sign out by [employer], PA-C.  Please review their note for patient HPI, physical exam, workup.  At this time the plan is admit pending CT scan.  CT shows metastatic lesions but also notes that there is some progression from the aortic region down to the pelvis pressing on the descending colon along with some colitis that would explain patient's reported fever.  At this time do feel patient benefit from admission and will call hospitalist for admission.  Care discussed with: Leeroy Gower  I spoke to the patient about being admitted for her colitis along with having fever and being immunocompromise however patient stated that she did not want to be admitted and wanted to be discharged.    Patient wants to leave against medical advice. Patient understands that his/her actions will lead to inadequate medical workup, and that he/she is at risk of complications of missed diagnosis, which includes morbidity and mortality.  Alternative options discussed  Opportunity to change mind given  Discussion witnessed by husband Patient is demonstrating good capacity to make decision. Patient understands that he/she needs to return to the ER immediately if his/her symptoms get worse.  I spoke to the pharmacist and she recommends Cipro  flagyl  for antibiotics and so we will order these and have the patient follow-up with her oncologist.  Patient verbalized understanding acceptance of this plan.  Patient stable to be discharged.   Victor Lynwood DASEN, PA-C 01/30/24 [id number]    Charlyn Sora, MD 01/30/24 [phone]

## 2024-01-29 NOTE — Sepsis Progress Note (Signed)
 Elink monitoring for the code sepsis protocol.

## 2024-01-30 MED ORDER — METRONIDAZOLE 500 MG PO TABS: 500.0000 mg | ORAL_TABLET | Freq: Two times a day (BID) | ORAL | 0 refills | Status: AC

## 2024-01-30 MED ORDER — CIPROFLOXACIN HCL 500 MG PO TABS: 500.0000 mg | ORAL_TABLET | Freq: Two times a day (BID) | ORAL | 0 refills | Status: AC

## 2024-01-30 NOTE — Discharge Instructions (Signed)
 80 request you are being discharged.  Please follow-up with your oncologist in regards resumes and ER visit.  I have prescribed for you Flagyl  and Cipro  to take his antibiotics for your colitis.  Please take your medications as prescribed and if symptoms change or worsen please return to the ER.

## 2024-01-31 LAB — URINE CULTURE

## 2024-02-04 LAB — CULTURE, BLOOD (ROUTINE X 2)
Culture: NO GROWTH
Culture: NO GROWTH
Special Requests: ADEQUATE
Special Requests: ADEQUATE

## 2024-02-06 ENCOUNTER — Ambulatory Visit: Payer: No Typology Code available for payment source | Admitting: Internal Medicine

## 2024-03-24 DEATH — deceased
